# Patient Record
Sex: Male | Born: 1990
Health system: Southern US, Community
[De-identification: ages and names within clinical notes are randomized; demographics above are authoritative.]

## PROBLEM LIST (undated history)

## (undated) DIAGNOSIS — L309 Dermatitis, unspecified: Secondary | ICD-10-CM

## (undated) DIAGNOSIS — M1990 Unspecified osteoarthritis, unspecified site: Secondary | ICD-10-CM

## (undated) HISTORY — DX: Dermatitis, unspecified: L30.9

## (undated) HISTORY — DX: Unspecified osteoarthritis, unspecified site: M19.90

---
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
## 2008-02-02 ENCOUNTER — Emergency Department (HOSPITAL_COMMUNITY): Admission: EM | Admit: 2008-02-02 | Discharge: 2008-02-02 | Payer: Self-pay | Admitting: Emergency Medicine

## 2008-10-06 ENCOUNTER — Emergency Department (HOSPITAL_COMMUNITY): Admission: EM | Admit: 2008-10-06 | Discharge: 2008-10-06 | Payer: Self-pay | Admitting: Family Medicine

## 2009-02-05 ENCOUNTER — Emergency Department (HOSPITAL_COMMUNITY): Admission: EM | Admit: 2009-02-05 | Discharge: 2009-02-05 | Payer: Self-pay | Admitting: Emergency Medicine

## 2010-04-26 ENCOUNTER — Emergency Department (HOSPITAL_COMMUNITY)
Admission: EM | Admit: 2010-04-26 | Discharge: 2010-04-26 | Payer: Self-pay | Source: Home / Self Care | Admitting: Family Medicine

## 2011-03-03 LAB — CULTURE, ROUTINE-ABSCESS

## 2011-05-04 ENCOUNTER — Inpatient Hospital Stay (INDEPENDENT_AMBULATORY_CARE_PROVIDER_SITE_OTHER)
Admission: RE | Admit: 2011-05-04 | Discharge: 2011-05-04 | Disposition: A | Discharge status: Home / Self Care | Payer: Self-pay | Source: Ambulatory Visit | Attending: Family Medicine | Admitting: Family Medicine

## 2011-05-04 DIAGNOSIS — L910 Hypertrophic scar: Secondary | ICD-10-CM

## 2012-04-30 ENCOUNTER — Encounter (HOSPITAL_COMMUNITY): Payer: Self-pay | Admitting: *Deleted

## 2012-04-30 ENCOUNTER — Emergency Department (INDEPENDENT_AMBULATORY_CARE_PROVIDER_SITE_OTHER)
Admission: EM | Admit: 2012-04-30 | Discharge: 2012-04-30 | Disposition: A | Discharge status: Home / Self Care | Payer: BC Managed Care – PPO | Source: Home / Self Care

## 2012-04-30 DIAGNOSIS — H6690 Otitis media, unspecified, unspecified ear: Secondary | ICD-10-CM

## 2012-04-30 MED ORDER — TRAMADOL HCL 50 MG PO TABS: 50.0000 mg | ORAL_TABLET | Freq: Four times a day (QID) | ORAL | Status: AC | PRN

## 2012-04-30 MED ORDER — AMOXICILLIN 500 MG PO CAPS: 500.0000 mg | ORAL_CAPSULE | Freq: Three times a day (TID) | ORAL | Status: AC

## 2012-04-30 NOTE — ED Notes (Signed)
Pt reports left ear pain since friday

## 2012-04-30 NOTE — Discharge Instructions (Signed)
Otitis Media, Adult  A middle ear infection is an infection in the space behind the eardrum. The medical name for this is "otitis media." It may happen after a common cold. It is caused by a germ that starts growing in that space. You may feel swollen glands in your neck on the side of the ear infection.  HOME CARE INSTRUCTIONS   · Take your medicine as directed until it is gone, even if you feel better after the first few days.  · Only take over-the-counter or prescription medicines for pain, discomfort, or fever as directed by your caregiver.  · Occasional use of a nasal decongestant a couple times per day may help with discomfort and help the eustachian tube to drain better.  Follow up with your caregiver in 10 to 14 days or as directed, to be certain that the infection has cleared. Not keeping the appointment could result in a chronic or permanent injury, pain, hearing loss and disability. If there is any problem keeping the appointment, you must call back to this facility for assistance.  SEEK IMMEDIATE MEDICAL CARE IF:   · You are not getting better in 2 to 3 days.  · You have pain that is not controlled with medication.  · You feel worse instead of better.  · You cannot use the medication as directed.  · You develop swelling, redness or pain around the ear or stiffness in your neck.  MAKE SURE YOU:   · Understand these instructions.  · Will watch your condition.  · Will get help right away if you are not doing well or get worse.  Document Released: 08/12/2004 Document Revised: 10/27/2011 Document Reviewed: 06/13/2008  ExitCare® Patient Information ©2012 [employer].

## 2012-04-30 NOTE — ED Provider Notes (Signed)
History     CSN: [id number]  Arrival date & time 04/30/12  1446   First MD Initiated Contact with Patient 04/30/12 1531      Chief Complaint  Patient presents with  . Otalgia     HPI Patient is 21 year old male who presents with main concern of left-sided ear pain that initially started one week ago and has been getting progressively worse. Patient reports history of ear infections in the last one being approximately year ago. He describes pain as sharp, intermittent, 7/10 in severity when present, no specific alleviating or aggravating symptoms, nonradiating pain. Patient denies any fevers and chills, other systemic symptoms. No recent trauma to ear, no recent use of antibiotics.  History reviewed. No pertinent past medical history.  History reviewed. No pertinent past surgical history.  History reviewed. No pertinent family history.  History  Substance Use Topics  . Smoking status: Never Smoker   . Smokeless tobacco: Never Used  . Alcohol Use: Yes     pt reports usage as twice a month     Review of Systems  Constitutional: Denies fever, chills, diaphoresis, appetite change and fatigue.  HEENT: Denies photophobia, eye pain, redness, hearing loss, sore throat, rhinorrhea, sneezing, mouth sores, trouble swallowing, neck pain, neck stiffness and tinnitus.   Respiratory: Denies SOB, DOE, cough, chest tightness,  and wheezing.   Cardiovascular: Denies chest pain, palpitations and leg swelling.  Gastrointestinal: Denies nausea, vomiting, abdominal pain, diarrhea, constipation, blood in stool and abdominal distention.  Genitourinary: Denies dysuria, urgency, frequency, hematuria, flank pain and difficulty urinating.  Musculoskeletal: Denies myalgias, back pain, joint swelling, arthralgias and gait problem.  Skin: Denies pallor, rash and wound.  Neurological: Denies dizziness, seizures, syncope, weakness, light-headedness, numbness and headaches.  Hematological: Denies adenopathy.  Easy bruising, personal or family bleeding history    Allergies  Review of patient's allergies indicates not on file.  Home Medications   Current Outpatient Rx  Name Route Sig Dispense Refill  . AMOXICILLIN 500 MG PO CAPS Oral Take 1 capsule (500 mg total) by mouth 3 (three) times daily. 21 capsule 0  . TRAMADOL HCL 50 MG PO TABS Oral Take 1 tablet (50 mg total) by mouth every 6 (six) hours as needed for pain. 30 tablet 0    BP 146/83  Pulse 57  Temp(Src) 98.9 F (37.2 C) (Oral)  Resp 18  SpO2 98%  Physical Exam  Constitutional: Vital signs reviewed.  Patient is a well-developed and well-nourished in no acute distress and cooperative with exam. Alert and oriented x3.  Head: Normocephalic and atraumatic Ear: TM normal on the right side. Left ear tympanic membrane erythematous and swollen, patient has significant pain during the examination, no pus or blood noted Mouth: no erythema or exudates, MMM Eyes: PERRL, EOMI, conjunctivae normal, No scleral icterus.  Neck: Supple, Trachea midline normal ROM, No JVD, mass, thyromegaly, or carotid bruit present.  Cardiovascular: RRR, S1 normal, S2 normal, no MRG, pulses symmetric and intact bilaterally Pulmonary/Chest: CTAB, no wheezes, rales, or rhonchi  ED Course  Procedures (including critical care time)   1. Ear infection    - Patient symptoms and physical exam findings consistent with otitis media - I will provide course of antibiotic - Patient was advised if symptoms do not improve or get worse he needs to see primary care physician - Patient also advised not to use Q-tips during the infection  MDM  Otitis media and antibiotic to be used for 1 week  Dorothea Ogle, MD 04/30/12 [phone]

## 2012-07-24 ENCOUNTER — Ambulatory Visit: Payer: Self-pay | Admitting: Internal Medicine

## 2012-09-07 ENCOUNTER — Ambulatory Visit: Payer: BC Managed Care – PPO | Admitting: Internal Medicine

## 2012-09-07 DIAGNOSIS — Z0289 Encounter for other administrative examinations: Secondary | ICD-10-CM

## 2014-07-10 ENCOUNTER — Emergency Department (HOSPITAL_COMMUNITY): Payer: Self-pay

## 2014-07-10 ENCOUNTER — Encounter (HOSPITAL_COMMUNITY): Payer: Self-pay | Admitting: Emergency Medicine

## 2014-07-10 ENCOUNTER — Emergency Department (HOSPITAL_COMMUNITY)
Admission: EM | Admit: 2014-07-10 | Discharge: 2014-07-10 | Disposition: A | Discharge status: Home / Self Care | Payer: Self-pay | Attending: Emergency Medicine | Admitting: Emergency Medicine

## 2014-07-10 DIAGNOSIS — M545 Low back pain, unspecified: Secondary | ICD-10-CM | POA: Insufficient documentation

## 2014-07-10 DIAGNOSIS — M549 Dorsalgia, unspecified: Secondary | ICD-10-CM

## 2014-07-10 DIAGNOSIS — IMO0001 Reserved for inherently not codable concepts without codable children: Secondary | ICD-10-CM | POA: Insufficient documentation

## 2014-07-10 DIAGNOSIS — R52 Pain, unspecified: Secondary | ICD-10-CM | POA: Insufficient documentation

## 2014-07-10 MED ORDER — TRAMADOL-ACETAMINOPHEN 37.5-325 MG PO TABS: 1.0000 | ORAL_TABLET | Freq: Four times a day (QID) | ORAL | Status: DC | PRN

## 2014-07-10 MED ORDER — METHOCARBAMOL 500 MG PO TABS: 500.0000 mg | ORAL_TABLET | Freq: Two times a day (BID) | ORAL | Status: DC

## 2014-07-10 NOTE — Discharge Instructions (Signed)
Take the prescribed medication as directed. Follow-up with a primary care physician in the area for close monitoring of symptoms and to re-check blood pressure. Return to the ED for new or worsening symptoms.   Emergency Department Resource Guide 1) Find a Doctor and Pay Out of Pocket Although you won't have to find out who is covered by your insurance plan, it is a good idea to ask around and get recommendations. You will then need to call the office and see if the doctor you have chosen will accept you as a new patient and what types of options they offer for patients who are self-pay. Some doctors offer discounts or will set up payment plans for their patients who do not have insurance, but you will need to ask so you aren't surprised when you get to your appointment.  2) Contact Your Local Health Department Not all health departments have doctors that can see patients for sick visits, but many do, so it is worth a call to see if yours does. If you don't know where your local health department is, you can check in your phone book. The CDC also has a tool to help you locate your state's health department, and many state websites also have listings of all of their local health departments.  3) Find a Walk-in Clinic If your illness is not likely to be very severe or complicated, you may want to try a walk in clinic. These are popping up all over the country in pharmacies, drugstores, and shopping centers. They're usually staffed by nurse practitioners or physician assistants that have been trained to treat common illnesses and complaints. They're usually fairly quick and inexpensive. However, if you have serious medical issues or chronic medical problems, these are probably not your best option.  No Primary Care Doctor: - Call Health Connect at  [phone] - they can help you locate a primary care doctor that  accepts your insurance, provides certain services, etc. - Physician Referral Service-  [phone]  Chronic Pain Problems: Organization         Address  Phone   Notes  [facility]  [phone] Patients need to be referred by their primary care doctor.   Medication Assistance: Organization         Address  Phone   Notes  [facility] [address] [place], [place] [address] [phone]) [phone] --Must be a resident of [place] -- Must have NO insurance coverage whatsoever (no Medicaid/ Medicare, etc.) -- The pt. MUST have a primary care doctor that directs their care regularly and follows them in the community   MedAssist  [phone]   [employer]  [phone]) [phone]    Agencies that provide inexpensive medical care: Organization         Address  Phone   Notes  [facility]  [phone]) [phone]   [facility]    [phone]   [facility] [address] [place], [place] [address] [phone]) [phone]   [facility] [address], [place] [phone]) [phone]   [facility]anned Parenthood    [phone]   [facility]    [phone]   [facility]  [address], [place] Phone:  [phone], Fax:  [phone]) [phone] Hours of Operation:  9 am - 6 pm, M-F.  Also accepts Medicaid/Medicare and self-pay.  [facility]  [address], [place] Phone: [phone], Fax: [phone]. Hours of Operation:  8:30 am - 5:30 pm, M-F.  Also accepts Medicaid and self-pay.  [facility] [place] [address] [place] Phone: [phone]   [place], [place], [place] [phone], Ext. [phone] Mondays & Thursdays: 7-9 AM.  First 15 patients are seen on a first come, first serve basis.    [place] Providers:  Organization         Address  Phone   Notes  [facility] [address],  [place] [phone] Also accepts self-pay patients.  [facility] [id number] [address], [place]  [phone]   [place], [address], [place] [phone]   [facility] [address] [place] [phone]   Lucianne Lei [address], [place]   [phone] Only accepts [place] Access [place] patients after they have their name applied to their card.   Self-Pay (no insurance) in [facility]:  Organization         Address  Phone   Notes  Sickle Cell Patients, [facility] [place] [phone]   [facility] Urgent Care [place] [phone]   [facility] [place]  [place], [place], [place] [phone]   [facility]/Dr. Osei-Bonsu  [address] [place] or [address], [place] [phone] Phone number for both [place] and [place] locations is the same.  [facility]gent [facility] and [facility] [address] [place] [phone]   [facility] [address] [place] or [address] [place] [address] [phone]   [facility] [address] [place] [phone], phone; [phone], fax Sees patients 1st and 3rd Saturday of every month.  Must not qualify for public or private insurance (i.e. Medicaid, Medicare, [place] Health Choice, Veterans' Benefits)  Household income should be no more than 200% of the poverty level The clinic cannot treat you if you are pregnant or think you are pregnant  Sexually transmitted diseases are not treated at the clinic.    Dental Care: Organization         Address  Phone  Notes  [facility] of [facility] [phone] Accepts children up to age 64 who are enrolled in  [place] or [place]; pregnant women with a Medicaid card; and children who have applied for Medicaid or [place] Health Choice, but were declined, whose parents can pay a reduced fee at time of service.  [facility] Department of [facility]  [address], [place] [phone] Accepts children up to age 59 who are enrolled in [place] or [place]; pregnant women with a Medicaid card; and children who have applied for Medicaid or [place] Health Choice, but were declined, whose parents can pay a reduced fee at time of service.  [facility] Adult [facility] Access PROGRAM  [place] [phone] Patients are seen by appointment only. Walk-ins are not accepted. [place] will see patients 70 years of age and older. Monday - Tuesday (8am-5pm) Most Wednesdays (8:30-5pm) $30 per visit, cash only  [place] [facility]  [address], [facility] [phone] Patients are seen by appointment only. Walk-ins are not accepted. [place] will see patients 55 years of age and older. One Wednesday Evening (Monthly: Volunteer Based).  $30 per visit, cash only  [place]  [phone] for adults; Children under age 16, call Graduate Pediatric Dentistry at [phone]. Children aged 15-14, please call [phone] to request a pediatric application.  Dental services are provided in all areas of dental care including fillings, crowns and bridges, complete and partial dentures, implants, gum treatment, root canals, and extractions. Preventive care is also provided. Treatment is provided to both adults and children. Patients are selected via a lottery and there is often a waiting list.   [facility] [address] [place]  [phone] [email or web address]   [facility] [address] [place], [place] [phone], Ext. [phone] Second and Fourth Thursday of each month, opens at 6:30  AM; Clinic ends at 9 AM.  Patients are seen on a first-come first-served basis, and a limited number are seen during each clinic.   [facility]  [address] Hillard Danker [place], [place] [phone]   Eligibility Requirements You must have lived in [place], [place], or [place] counties for at least the last three months.   You cannot be eligible for state or federal sponsored [employer], including [employer], [place], or [employer].   You generally cannot be eligible for healthcare insurance through your employer.    How to apply: Eligibility screenings are held every Tuesday and Wednesday afternoon from 1:00 pm until 4:00 pm. You do not need an appointment for the interview!  [facility] [address] [place], [place]   [employer]  [place] Department  [place]  [phone]    Behavioral Health Resources in the Community: Intensive Outpatient Programs Organization         Address  Phone  Notes  [place]. [address], [place], [place] [phone]   [facility]patient [address], [place], [employer]   ADS: Alcohol & Drug Svcs [address], [place], [place]   [place] [address]  [place], [place] or [phone]   Substance Abuse Resources Organization         Address  Phone  Notes  Alcohol and Drug Services  [phone]   [place]  [phone]   The [place]   Chinita Pester  [phone]   Residential & Outpatient Substance Abuse Program  [phone]   Psychological Services Organization         Address  Phone  Notes  [facility] [place]  [place]  [phone]   [place] [address] [place] [phone] or  [phone]    Mobile Crisis Teams Organization         Address  Phone  Notes  [facility], Mobile Crisis Care Unit  [phone]   [facility] Psych[facility]  [address] [place], [place]   [place] [address] [place] [phone]    Self-Help/Support Groups Organization         Address  Phone             Notes  [facility]ental Health Assoc[facility] of [place] - variety of support groups  [place] Call for more information  Narcotics Anonymous (NA), Caring Services [address], [employer]   2 meetings at this location   Special educational needs teacher         Address  Phone  Notes  [facility] [place],    [place]  [phone]   [facility]  [address] [place] [phone], [place], [place]   [place], [place] [phone] Admissions: 8am-3pm M-F  Incentives Substance [place] [address]    [place], [place] [phone]   [facility] [address] [place], [place], [place]   [facility] [address]  [place], [place]   [facility] - Intensive Outpatient [address], [place], [place]   [facility] ([place].,  [place], [place] [phone] or [phone]   [facility] Services (RTS) [address], [place], [place] Accepts Medicaid  [facility] [address],  [place] [phone] Substance Abuse/Addiction Treatment   [facility] Organization         Address  Phone  Notes  [facility]  [phone]   Domenic Schwab, PhD [address] Arlis Porta [place], [place]   [phone] or [phone]   [place], [place] [phone]   [facility] [address]  [place], [place] [phone] Insurance/Medicaid/sponsorship through [facility] and Families [address], [address] [place]                                    [place], [place] [phone] [place] [address], [place] [phone]    Dr. Adele Schilder  [phone]   [facility] Dept. 1) [address], [place] 2) [place] 3)  [place] [address], [place] [phone]  [phone]   [place] [phone] or [phone] (After Hours)

## 2014-07-10 NOTE — ED Provider Notes (Signed)
CSN: [id number]     Arrival date & time 07/10/14  1111 History  This chart was scribed for Ethelda ChickMartha K Linker, MD by Elon SpannerGarrett Cook, ED Scribe. This patient was seen in room WTR6/WTR6 and the patient's care was started at 12:04 PM.    Chief Complaint  Patient presents with  . Back Pain    The history is provided by the patient. No language interpreter was used.    HPI Comments: James Quinn is a 23 y.o. male who presents to the Emergency Department complaining of an acute exacerbation of intermittent, central lower back pain without radiation onset 1 year ago with worsening 1 month ago.  Patient states he was a frequent weightlifter, alternating between lighter and heavier weights before the pain in the area caused him to stop several months ago.  He also states he has a disabled friend who he would pick up at times.  Patient decribes the pain as "tight" and "stiff" in the areas surrounding his central lower back with the most pain in his central lower pack.  He states he has difficulty standing for long periods of time without an exacerbation of the pain.  He states that rest relieves the pain.  Patient denies injury to area, surgery to area, imaging of area.  Patient denies numbness/tingling of legs, headaches, vision changes, history of HTN.  No numbness, paresthesias or weakness of extremities.  No loss of bowel or bladder control.   History reviewed. No pertinent past medical history. History reviewed. No pertinent past surgical history. History reviewed. No pertinent family history. History  Substance Use Topics  . Smoking status: Never Smoker   . Smokeless tobacco: Never Used  . Alcohol Use: Yes     Comment: occ    Review of Systems  Eyes: Negative for visual disturbance.  Musculoskeletal: Positive for myalgias.  All other systems reviewed and are negative.     Allergies  Review of patient's allergies indicates no known allergies.  Home Medications   Prior to Admission  medications   Medication Sig Start Date End Date Taking? Authorizing Provider  Aspirin-Salicylamide-Caffeine (BC FAST PAIN RELIEF) [phone] MG PACK Take 1 packet by mouth every 6 (six) hours as needed (for pain).   Yes Historical Provider, MD   BP 159/107  Pulse 96  Temp(Src) 99.2 F (37.3 C) (Oral)  Resp 16  SpO2 97%  Physical Exam  Nursing note and vitals reviewed. Constitutional: He is oriented to person, place, and time. He appears well-developed and well-nourished.  HENT:  Head: Normocephalic and atraumatic.  Mouth/Throat: Oropharynx is clear and moist.  Eyes: Conjunctivae and EOM are normal. Pupils are equal, round, and reactive to light.  Neck: Normal range of motion.  Cardiovascular: Normal rate, regular rhythm and normal heart sounds.   Pulmonary/Chest: Effort normal and breath sounds normal. No respiratory distress. He has no wheezes.  Abdominal: Soft.  Musculoskeletal: Normal range of motion.  Exam limited due to body habitus, endorses pain of midline lumbar spine however no tenderness to palpation; normal strength and sensation of BLE; negative SLR bilaterally; ambulating unassisted without difficulty  Neurological: He is alert and oriented to person, place, and time.  Skin: Skin is warm and dry.  Psychiatric: He has a normal mood and affect.    ED Course  Procedures (including critical care time)  DIAGNOSTIC STUDIES: Oxygen Saturation is 97% on RA, normal by my interpretation.    COORDINATION OF CARE:  12:11 PM Discussed plan to order imaging of pt's  back.  Patient acknowledges and agrees with plan.    Labs Review Labs Reviewed - No data to display  Imaging Review Dg Lumbar Spine Complete  07/10/2014   CLINICAL DATA:  Mid and low back pain without trauma.  EXAM: LUMBAR SPINE - COMPLETE 4+ VIEW  COMPARISON:  None.  FINDINGS: Five lumbar type vertebral bodies. Sacroiliac joints are symmetric. Maintenance of vertebral body height and alignment. Possible  degenerative disc disease at the lumbosacral junction. Suboptimally evaluated.  IMPRESSION: No acute osseous abnormality.   Electronically Signed   By: Jeronimo Greaves M.D.   On: 07/10/2014 12:27     EKG Interpretation None      MDM   Final diagnoses:  Back pain, unspecified location   Imaging negative for acute abnormalities. No red flag symptoms on exam to suggest cauda equina, spinal cord injury, or other serious pathology. Patient will be discharged home with pain medication. His blood pressure is elevated however he has no signs of end organ damage on exam today. I recommended that he follow up with her primary care physician for recheck of his back as well as monitoring of his blood pressure, resource guide given.  Discussed plan with patient, he/she acknowledged understanding and agreed with plan of care.  Return precautions given for new or worsening symptoms.  I personally performed the services described in this documentation, which was scribed in my presence. The recorded information has been reviewed and is accurate.  Garlon Hatchet, PA-C 07/10/14 1311

## 2014-07-10 NOTE — ED Provider Notes (Signed)
Medical screening examination/treatment/procedure(s) were performed by non-physician practitioner and as supervising physician I was immediately available for consultation/collaboration.   EKG Interpretation None       Ethelda ChickMartha K Linker, MD 07/10/14 1313

## 2014-07-10 NOTE — ED Notes (Signed)
Pt c/o increasing lower back pain x 1 year.  Pain score 6/10.  Denies injury.  Pt sts "I thought that it was from lifting weights.  Sometimes, it gets so bad I can't sleep or if I'm walking, I have to stop and bend over to stretch it out."  Denies GU complaints and urinary complaints.  Denies numbness and tingling.

## 2015-02-11 ENCOUNTER — Ambulatory Visit: Payer: Self-pay | Admitting: Family

## 2015-04-10 ENCOUNTER — Other Ambulatory Visit (INDEPENDENT_AMBULATORY_CARE_PROVIDER_SITE_OTHER): Payer: PRIVATE HEALTH INSURANCE

## 2015-04-10 ENCOUNTER — Ambulatory Visit (INDEPENDENT_AMBULATORY_CARE_PROVIDER_SITE_OTHER): Payer: PRIVATE HEALTH INSURANCE | Admitting: Family

## 2015-04-10 ENCOUNTER — Encounter: Payer: Self-pay | Admitting: Family

## 2015-04-10 DIAGNOSIS — Z23 Encounter for immunization: Secondary | ICD-10-CM | POA: Diagnosis not present

## 2015-04-10 LAB — TSH: TSH: 4.81 u[IU]/mL — AB (ref 0.35–4.50)

## 2015-04-10 NOTE — Patient Instructions (Signed)
Thank you for choosing [facility]. Y Summary/Instructions:   Please start using MyFitnessPal and tracking your intake and exercise. Begin to think about all foods that you are eating and how they are helping you reach your goal.    Please stop in the lab before leaving located in the basement.   Exercise to Lose Weight Exercise and a healthy diet may help you lose weight. Your doctor may suggest specific exercises. EXERCISE IDEAS AND TIPS  Choose low-cost things you enjoy doing, such as walking, bicycling, or exercising to workout videos.  Take stairs instead of the elevator.  Walk during your lunch break.  Park your car further away from work or school.  Go to a gym or an exercise class.  Start with 5 to 10 minutes of exercise each day. Build up to 30 minutes of exercise 4 to 6 days a week.  Wear shoes with good support and comfortable clothes.  Stretch before and after working out.  Work out until you breathe harder and your heart beats faster.  Drink extra water when you exercise.  Do not do so much that you hurt yourself, feel dizzy, or get very short of breath. Exercises that burn about 150 calories:  Running 1  miles in 15 minutes.  Playing volleyball for 45 to 60 minutes.  Washing and waxing a car for 45 to 60 minutes.  Playing touch football for 45 minutes.  Walking 1  miles in 35 minutes.  Pushing a stroller 1  miles in 30 minutes.  Playing basketball for 30 minutes.  Raking leaves for 30 minutes.  Bicycling 5 miles in 30 minutes.  Walking 2 miles in 30 minutes.  Dancing for 30 minutes.  Shoveling snow for 15 minutes.  Swimming laps for 20 minutes.  Walking up stairs for 15 minutes.  Bicycling 4 miles in 15 minutes.  Gardening for 30 to 45 minutes.  Jumping rope for 15 minutes.  Washing windows or floors for 45 to 60 minutes. Document Released: 12/10/2010 Document Revised: 01/30/2012 Document Reviewed: 12/10/2010 [facility]  Patient Information 2015 [place], [place]. This information is not intended to replace advice given to you by your health care provider. Make sure you discuss any questions you have with your health care provider.

## 2015-04-10 NOTE — Progress Notes (Signed)
Pre visit review using our clinic review tool, if applicable. No additional management support is needed unless otherwise documented below in the visit note. 

## 2015-04-10 NOTE — Progress Notes (Signed)
Subjective:    Patient ID: James Quinn, male    DOB: 17-Feb-1991, 24 y.o.   MRN: [id number]  Chief Complaint  Patient presents with  . Establish Care    wants to lose weight, says wants to know how to lose weight the correct way, tries to go to the gym 3 times a week, having trouble eating good    HPI:  James Quinn is a 24 y.o. male with a PMH of morbid obesity who presents today for an office visit to establish care.  1) Weight - Has had the associated symptom of overwieght/obese since high school when he played football. Maintained in the 300-400 range for about 2 years after high school and has continued to gain weight since then. Modifying factors include various diets which helped a little, but when the holidays started the diet fell apart. Severity of the weight has been effecting hjs joints on occasion. Averages about 2 meals per day described as large and occasionally will binge eat at night. Consisting of primarily of meats and starches and vegetables. Exercise habits are 2-3 times per week and does resistance training with minimal cardio for about an hour and a half.    Wt Readings from Last 3 Encounters:  04/10/15 499 lb (226.345 kg)    No Known Allergies   Outpatient Prescriptions Prior to Visit  Medication Sig Dispense Refill  . Aspirin-Salicylamide-Caffeine (BC FAST PAIN RELIEF) 650-195-33.3 MG PACK Take 1 packet by mouth every 6 (six) hours as needed (for pain).    . methocarbamol (ROBAXIN) 500 MG tablet Take 1 tablet (500 mg total) by mouth 2 (two) times daily. 20 tablet 0  . traMADol-acetaminophen (ULTRACET) 37.5-325 MG per tablet Take 1 tablet by mouth every 6 (six) hours as needed. 20 tablet 0   No facility-administered medications prior to visit.     Past Medical History  Diagnosis Date  . Arthritis     lower back     History reviewed. No pertinent past surgical history.   Family History  Problem Relation Age of Onset  . Arthritis  Father   . Diabetes Sister   . Arthritis Maternal Grandmother   . Prostate cancer Maternal Grandfather   . Arthritis Paternal Grandmother      History   Social History  . Marital Status: Single    Spouse Name: N/A  . Number of Children: 0  . Years of Education: 14   Occupational History  . Engineer, materialsecurity Officer    Social History Main Topics  . Smoking status: Never Smoker   . Smokeless tobacco: Never Used  . Alcohol Use: Yes     Comment: occ  . Drug Use: No  . Sexual Activity: Yes   Other Topics Concern  . Not on file   Social History Narrative   Fun: Workout, movies, go out bowling.   Denies religious beliefs effecting healthcare.     Review of Systems  Constitutional: Negative for fever and chills.  Respiratory: Negative for chest tightness and shortness of breath.   Cardiovascular: Negative for chest pain, palpitations and leg swelling.  Endocrine: Negative for polydipsia, polyphagia and polyuria.      Objective:    BP 160/88 mmHg  Pulse 66  Temp(Src) 99.2 F (37.3 C) (Oral)  Resp 18  Ht 6' (1.829 m)  Wt 499 lb (226.345 kg)  BMI 67.66 kg/m2  SpO2 98% Nursing note and vital signs reviewed.  Physical Exam  Constitutional: He is oriented to person,  place, and time. He appears well-developed and well-nourished. No distress.  Morbidly obese gentleman seated in the chair, appears older than his stated age and is dressed appropriately for the situation.  Cardiovascular: Normal rate, regular rhythm, normal heart sounds and intact distal pulses.   Pulmonary/Chest: Effort normal and breath sounds normal.  Neurological: He is alert and oriented to person, place, and time.  Skin: Skin is warm and dry.  Psychiatric: He has a normal mood and affect. His behavior is normal. Judgment and thought content normal.       Assessment & Plan:

## 2015-04-10 NOTE — Assessment & Plan Note (Signed)
BMI of 67.6 indicates severe morbid obesity. Interested in losing weight. Obtain TSH to rule out thyroid disease. Discussed with patient importance of increasing physical activity to 30 minutes of moderate level intensity every day of the week. Also improving nutrient density of his diet. Increase fruit and vegetable intake and decreasing saturated fat intake. Discussed as little as 200 cal per day less than when he's already consuming may result in weight loss as opposed to weight gain. Recommend calorie counting with MyFitnessPal to track his progress. Follow-up in one month to review log and weigh-in.

## 2015-04-12 ENCOUNTER — Telehealth: Payer: Self-pay | Admitting: Family

## 2015-04-12 DIAGNOSIS — R7989 Other specified abnormal findings of blood chemistry: Secondary | ICD-10-CM

## 2015-04-12 NOTE — Telephone Encounter (Signed)
Please inform patient that his thyroid function is slightly elevated, indicating a potential for hypothyroidism. To confirm this I would like him to complete additional blood work at his convenience. No fasting is needed and this can be completed anytime of the day. If the reading is confirmed with the new tests, we will consider replacing thyroid hormone.

## 2015-04-13 ENCOUNTER — Other Ambulatory Visit (INDEPENDENT_AMBULATORY_CARE_PROVIDER_SITE_OTHER): Payer: PRIVATE HEALTH INSURANCE

## 2015-04-13 ENCOUNTER — Other Ambulatory Visit: Payer: Self-pay | Admitting: Family

## 2015-04-13 DIAGNOSIS — R7989 Other specified abnormal findings of blood chemistry: Secondary | ICD-10-CM

## 2015-04-13 DIAGNOSIS — R946 Abnormal results of thyroid function studies: Secondary | ICD-10-CM | POA: Diagnosis not present

## 2015-04-13 LAB — T4, FREE: Free T4: 0.64 ng/dL (ref 0.60–1.60)

## 2015-04-13 LAB — TSH: TSH: 3.25 u[IU]/mL (ref 0.35–4.50)

## 2015-04-13 NOTE — Telephone Encounter (Signed)
Pt aware of results 

## 2015-04-13 NOTE — Telephone Encounter (Signed)
Patient returned your call.

## 2015-04-13 NOTE — Telephone Encounter (Signed)
LVM for pt to call back.

## 2015-04-14 ENCOUNTER — Telehealth: Payer: Self-pay | Admitting: Family

## 2015-04-14 LAB — THYROID ANTIBODIES: Thyroperoxidase Ab SerPl-aCnc: 1 IU/mL (ref <9)

## 2015-04-14 NOTE — Telephone Encounter (Signed)
Pt aware.

## 2015-04-14 NOTE — Telephone Encounter (Signed)
Your repeat labs show that your thyroid function is within the normal ranges and right now there are no indications for medication. We will continue to monitor your thyroid annually or more if needed.

## 2015-04-24 ENCOUNTER — Encounter (HOSPITAL_COMMUNITY): Payer: Self-pay | Admitting: Emergency Medicine

## 2015-04-24 ENCOUNTER — Emergency Department (HOSPITAL_COMMUNITY)
Admission: EM | Admit: 2015-04-24 | Discharge: 2015-04-24 | Disposition: A | Discharge status: Home / Self Care | Payer: PRIVATE HEALTH INSURANCE | Attending: Emergency Medicine | Admitting: Emergency Medicine

## 2015-04-24 DIAGNOSIS — Z8739 Personal history of other diseases of the musculoskeletal system and connective tissue: Secondary | ICD-10-CM | POA: Insufficient documentation

## 2015-04-24 DIAGNOSIS — H6692 Otitis media, unspecified, left ear: Secondary | ICD-10-CM | POA: Diagnosis not present

## 2015-04-24 DIAGNOSIS — H9202 Otalgia, left ear: Secondary | ICD-10-CM | POA: Diagnosis present

## 2015-04-24 MED ORDER — AMOXICILLIN-POT CLAVULANATE 875-125 MG PO TABS: 1.0000 | ORAL_TABLET | Freq: Two times a day (BID) | ORAL | Status: DC

## 2015-04-24 MED ORDER — AMOXICILLIN-POT CLAVULANATE 875-125 MG PO TABS
1.0000 | ORAL_TABLET | Freq: Once | ORAL | Status: AC
  Administered 2015-04-24: 1 via ORAL
  Filled 2015-04-24: qty 1

## 2015-04-24 MED ORDER — ACETAMINOPHEN 500 MG PO TABS
1000.0000 mg | ORAL_TABLET | Freq: Once | ORAL | Status: AC
  Administered 2015-04-24: 1000 mg via ORAL
  Filled 2015-04-24: qty 2

## 2015-04-24 NOTE — ED Provider Notes (Signed)
CSN: [id number]     Arrival date & time 04/24/15  1942 History  This chart was scribed for a non-physician practitioner, Emilia BeckKaitlyn Travious Vanover, PA-C working with Mirian MoMatthew Gentry, MD by [place] Peace, ED Scribe. The patient was seen in WTR8/WTR8. The patient's care was started at 9:41 PM.    Chief Complaint  Patient presents with  . Otalgia      Patient is a 24 y.o. male presenting with ear pain. The history is provided by the patient. No language interpreter was used.  Otalgia Associated symptoms: no ear discharge, no fever and no sore throat     HPI Comments: James Quinn is a 24 y.o. male who presents to the Emergency Department complaining of left ear pain and fullness x 2 days that has gotten progressively worse. Pt notes he has tried using OTC ear drops and "Sweet Oil" to address symptoms without any relief. He denies any ear drainage, fever, chills, or sore throat.    Past Medical History  Diagnosis Date  . Arthritis     lower back   History reviewed. No pertinent past surgical history. Family History  Problem Relation Age of Onset  . Arthritis Father   . Diabetes Sister   . Arthritis Maternal Grandmother   . Prostate cancer Maternal Grandfather   . Arthritis Paternal Grandmother    History  Substance Use Topics  . Smoking status: Never Smoker   . Smokeless tobacco: Never Used  . Alcohol Use: Yes     Comment: occ    Review of Systems  Constitutional: Negative for fever and chills.  HENT: Positive for ear pain (left). Negative for ear discharge and sore throat.   All other systems reviewed and are negative.     Allergies  Review of patient's allergies indicates no known allergies.  Home Medications   Prior to Admission medications   Not on File   BP 160/86 mmHg  Pulse 87  Temp(Src) 99.1 F (37.3 C) (Oral)  Resp 18  SpO2 99% Physical Exam  Constitutional: He is oriented to person, place, and time. He appears well-developed and well-nourished. No  distress.  HENT:  Head: Normocephalic and atraumatic.  Mouth/Throat: Oropharynx is clear and moist. No oropharyngeal exudate.  Pain with manipulation of the left auricle. External left ear canal edematous. Tenderness to palpation of preauricular and post auricular area. Right ear unremarkable.   Eyes: Conjunctivae and EOM are normal.  Neck: Normal range of motion. Neck supple. No tracheal deviation present.  Cardiovascular: Normal rate.   Pulmonary/Chest: Effort normal. No respiratory distress.  Abdominal: Soft. He exhibits no distension. There is no tenderness. There is no rebound.  Musculoskeletal: Normal range of motion.  Neurological: He is alert and oriented to person, place, and time.  Skin: Skin is warm and dry.  Psychiatric: He has a normal mood and affect. His behavior is normal.  Nursing note and vitals reviewed.   ED Course  Procedures (including critical care time) Labs Review Labs Reviewed - No data to display  Imaging Review No results found.   EKG Interpretation None     Medications - No data to display  9:43 PM- Treatment plan was discussed with patient who verbalizes understanding and agrees.   MDM   Final diagnoses:  Acute left otitis media, recurrence not specified, unspecified otitis media type    Patient has otitis media on the left. Patient will be treated with augmentin. Vitals stable and patient afebrile.   I personally performed the services described  in this documentation, which was scribed in my presence. The recorded information has been reviewed and is accurate.    Emilia Beck, PA-C 04/24/15 2149  Mirian Mo, MD 04/25/15 1723

## 2015-04-24 NOTE — ED Notes (Signed)
Pt c/o L ear pain and fullness x 2 days. Pt Denies drainage, sore throat, fever. A&Ox4 and ambulatory. Pt has been using OTC ear drops without relief.

## 2015-04-24 NOTE — Discharge Instructions (Signed)
Take augmentin as directed until gone. Refer to attached documents for more information.  °

## 2015-04-27 ENCOUNTER — Encounter (HOSPITAL_COMMUNITY): Payer: Self-pay | Admitting: Family Medicine

## 2015-04-27 ENCOUNTER — Emergency Department (HOSPITAL_COMMUNITY)
Admission: EM | Admit: 2015-04-27 | Discharge: 2015-04-27 | Disposition: A | Discharge status: Home / Self Care | Payer: PRIVATE HEALTH INSURANCE | Attending: Emergency Medicine | Admitting: Emergency Medicine

## 2015-04-27 DIAGNOSIS — H6092 Unspecified otitis externa, left ear: Secondary | ICD-10-CM | POA: Insufficient documentation

## 2015-04-27 DIAGNOSIS — Z8739 Personal history of other diseases of the musculoskeletal system and connective tissue: Secondary | ICD-10-CM | POA: Insufficient documentation

## 2015-04-27 DIAGNOSIS — H9202 Otalgia, left ear: Secondary | ICD-10-CM | POA: Diagnosis present

## 2015-04-27 MED ORDER — CIPROFLOXACIN-HYDROCORTISONE 0.2-1 % OT SUSP: 3.0000 [drp] | Freq: Two times a day (BID) | OTIC | Status: DC

## 2015-04-27 NOTE — ED Provider Notes (Signed)
CSN: [id number]     Arrival date & time 04/27/15  0829 History  This chart was scribed for non-physician practitioner, Roxy Horseman, PA-C working with Tilden Fossa, MD found by Placido Sou, ED scribe. This patient was seen in room TR05C/TR05C and the patient's care was started at 9:19 AM    Chief Complaint  Patient presents with  . Otalgia    The history is provided by the patient. No language interpreter was used.    HPI Comments: James Quinn is a 24 y.o. male who presents to the Emergency Department complaining of worsening, mild, ear pain with onset 4 days ago. Pt notes that his ear feels swollen with pain radiating to his jaw line. Pt notes visiting his PCP 3 days prior and was prescribed amoxicillin. Pt additionally notes taking tylenol and ibuprofen for pain with little relief of symptoms. Pt also took OTC ear drops with no change in symptoms. Pt denies any history of DM.    Past Medical History  Diagnosis Date  . Arthritis     lower back   History reviewed. No pertinent past surgical history. Family History  Problem Relation Age of Onset  . Arthritis Father   . Diabetes Sister   . Arthritis Maternal Grandmother   . Prostate cancer Maternal Grandfather   . Arthritis Paternal Grandmother    History  Substance Use Topics  . Smoking status: Never Smoker   . Smokeless tobacco: Never Used  . Alcohol Use: Yes     Comment: occ    Review of Systems  Constitutional: Negative for fever and chills.  HENT: Positive for ear pain.   Respiratory: Negative for shortness of breath.   Cardiovascular: Negative for chest pain.  Gastrointestinal: Negative for nausea, vomiting, diarrhea and constipation.  Genitourinary: Negative for dysuria.      Allergies  Review of patient's allergies indicates no known allergies.  Home Medications   Prior to Admission medications   Medication Sig Start Date End Date Taking? Authorizing Provider  amoxicillin-clavulanate  (AUGMENTIN) 875-125 MG per tablet Take 1 tablet by mouth every 12 (twelve) hours. 04/24/15   Kaitlyn Szekalski, PA-C   BP 155/92 mmHg  Pulse 87  Temp(Src) 98.5 F (36.9 C) (Oral)  Resp 22 Physical Exam  Constitutional: He is oriented to person, place, and time. He appears well-developed and well-nourished. No distress.  HENT:  Head: Normocephalic and atraumatic.  Mouth/Throat: Oropharynx is clear and moist.  Left otitis externa with edematous canal, still patent for drops  Eyes: Conjunctivae and EOM are normal. Pupils are equal, round, and reactive to light.  Neck: Normal range of motion. Neck supple. No tracheal deviation present.  Cardiovascular: Normal rate and normal heart sounds.   Pulmonary/Chest: Breath sounds normal. No respiratory distress.  Abdominal: Soft. He exhibits no distension.  Musculoskeletal: Normal range of motion.  Neurological: He is alert and oriented to person, place, and time.  Skin: Skin is warm and dry.  Psychiatric: He has a normal mood and affect. His behavior is normal.  Nursing note and vitals reviewed.   ED Course  Procedures   COORDINATION OF CARE: 9:22 AM Discussed treatment plan with pt at bedside including ear drops in addition to his current amoxicillin prescription and pt agreed to plan.  Labs Review Labs Reviewed - No data to display  Imaging Review No results found.   EKG Interpretation None      MDM   Final diagnoses:  Otitis externa, left    Patient with otitis  externa.  Likely concurrent with OM.  Being treated with Augmentin.  Instructed to continue this.  Will add Cipro HC drops.  No evidence of mastitis.  I personally performed the services described in this documentation, which was scribed in my presence. The recorded information has been reviewed and is accurate.     Roxy Horsemanobert Takeyah Wieman, PA-C 04/27/15 0930  Tilden FossaElizabeth Rees, MD 04/27/15 [phone]

## 2015-04-27 NOTE — ED Notes (Signed)
C/o left ear pain. [facility] and placed on Amoxicillin, which pt reports he has been taking as directed. States ear pain is worsening.

## 2015-04-27 NOTE — ED Notes (Signed)
Pt her for continued left ear pain and worsening symptoms since Friday after abx

## 2015-04-27 NOTE — Discharge Instructions (Signed)

## 2015-04-27 NOTE — ED Notes (Signed)
Waiting for discharge instructions. Printer not working. 

## 2015-04-29 ENCOUNTER — Encounter: Payer: Self-pay | Admitting: Family

## 2015-04-29 ENCOUNTER — Ambulatory Visit (INDEPENDENT_AMBULATORY_CARE_PROVIDER_SITE_OTHER): Payer: PRIVATE HEALTH INSURANCE | Admitting: Family

## 2015-04-29 VITALS — BP 118/82 | HR 88 | Temp 97.7°F | Wt >= 6400 oz

## 2015-04-29 DIAGNOSIS — H60392 Other infective otitis externa, left ear: Secondary | ICD-10-CM | POA: Diagnosis not present

## 2015-04-29 MED ORDER — NEOMYCIN-POLYMYXIN-HC 1 % OT SOLN: 4.0000 [drp] | Freq: Three times a day (TID) | OTIC | Status: DC

## 2015-04-29 NOTE — Patient Instructions (Signed)
Thank you for choosing [facility].  Summary/Instructions:  Your prescription(s) have been submitted to your pharmacy or been printed and provided for you. Please take as directed and contact our office if you believe you are having problem(s) with the medication(s) or have any questions.  If your symptoms worsen or fail to improve, please contact our office for further instruction, or in case of emergency go directly to the emergency room at the closest medical facility.   Otitis Externa Otitis externa is a bacterial or fungal infection of the outer ear canal. This is the area from the eardrum to the outside of the ear. Otitis externa is sometimes called "swimmer's ear." CAUSES  Possible causes of infection include:  Swimming in dirty water.  Moisture remaining in the ear after swimming or bathing.  Mild injury (trauma) to the ear.  Objects stuck in the ear (foreign body).  Cuts or scrapes (abrasions) on the outside of the ear. SIGNS AND SYMPTOMS  The first symptom of infection is often itching in the ear canal. Later signs and symptoms may include swelling and redness of the ear canal, ear pain, and yellowish-white fluid (pus) coming from the ear. The ear pain may be worse when pulling on the earlobe. DIAGNOSIS  Your health care provider will perform a physical exam. A sample of fluid may be taken from the ear and examined for bacteria or fungi. TREATMENT  Antibiotic ear drops are often given for 10 to 14 days. Treatment may also include pain medicine or corticosteroids to reduce itching and swelling. HOME CARE INSTRUCTIONS   Apply antibiotic ear drops to the ear canal as prescribed by your health care provider.  Take medicines only as directed by your health care provider.  If you have diabetes, follow any additional treatment instructions from your health care provider.  Keep all follow-up visits as directed by your health care provider. PREVENTION   Keep your ear  dry. Use the corner of a towel to absorb water out of the ear canal after swimming or bathing.  Avoid scratching or putting objects inside your ear. This can damage the ear canal or remove the protective wax that lines the canal. This makes it easier for bacteria and fungi to grow.  Avoid swimming in lakes, polluted water, or poorly chlorinated pools.  You may use ear drops made of rubbing alcohol and vinegar after swimming. Combine equal parts of white vinegar and alcohol in a bottle. Put 3 or 4 drops into each ear after swimming. SEEK MEDICAL CARE IF:   You have a fever.  Your ear is still red, swollen, painful, or draining pus after 3 days.  Your redness, swelling, or pain gets worse.  You have a severe headache.  You have redness, swelling, pain, or tenderness in the area behind your ear. MAKE SURE YOU:   Understand these instructions.  Will watch your condition.  Will get help right away if you are not doing well or get worse. Document Released: 11/07/2005 Document Revised: 03/24/2014 Document Reviewed: 11/24/2011 ExitCare Patient Information 2015 [employer]. This information is not intended to replace advice given to you by your health care provider. Make sure you discuss any questions you have with your health care provider.   

## 2015-04-29 NOTE — Progress Notes (Signed)
   Subjective:    Patient ID: James Quinn, male    DOB: Mar 13, 1991, 24 y.o.   MRN: [id number]  Chief Complaint  Patient presents with  . Ear Pain    HPI:  James HurdleChristopher A Knape is a 24 y.o. male with a PMH of morbid obesity who presents today for an acute office visit.   Recently seen in the ED for left ear pain for about 2 days at the time. Diagnosed with otitis media and started on Augmentin and Tylenol. Approximately 3 days later was seen again for left ear pain and was diagnosed with otitis externa and treated with Cirpo-HC. Indicates that he was not able to start the ear drops secondary to the cost being $300.   Currently experience associated symptom of pain in his left ear described as achy and severity of 4/10. Indicates that he cannot hear anything out of his left ear. Denies any discharge.   No Known Allergies   Current Outpatient Prescriptions on File Prior to Visit  Medication Sig Dispense Refill  . amoxicillin-clavulanate (AUGMENTIN) 875-125 MG per tablet Take 1 tablet by mouth every 12 (twelve) hours. 14 tablet 0  . ciprofloxacin-hydrocortisone (CIPRO HC) otic suspension Place 3 drops into the left ear 2 (two) times daily. (Patient not taking: Reported on 04/29/2015) 10 mL 0   No current facility-administered medications on file prior to visit.    Review of Systems  Constitutional: Negative for fever and chills.  HENT: Positive for ear discharge and ear pain.       Objective:    BP 118/82 mmHg  Pulse 88  Temp(Src) 97.7 F (36.5 C)  Wt 493 lb (223.623 kg)  SpO2 98% Nursing note and vital signs reviewed.  Physical Exam  Constitutional: He is oriented to person, place, and time. He appears well-developed and well-nourished. No distress.  HENT:  Left Ear: There is drainage and swelling. No tenderness. Decreased hearing is noted.  Cardiovascular: Normal rate, regular rhythm, normal heart sounds and intact distal pulses.   Pulmonary/Chest: Effort normal  and breath sounds normal.  Neurological: He is alert and oriented to person, place, and time.  Skin: Skin is warm and dry.  Psychiatric: He has a normal mood and affect. His behavior is normal. Judgment and thought content normal.       Assessment & Plan:   Problem List Items Addressed This Visit      Nervous and Auditory   Otitis, externa, infective - Primary    Symptoms and exam consistent with otitis externa. Unable to start the Cipro HC. Start Cortisporin. Follow-up if symptoms worsen or fail to improve, or if unable to afford medication.      Relevant Medications   NEOMYCIN-POLYMYXIN-HYDROCORTISONE (CORTISPORIN) 1 % SOLN otic solution

## 2015-04-29 NOTE — Progress Notes (Signed)
Pre visit review using our clinic review tool, if applicable. No additional management support is needed unless otherwise documented below in the visit note. 

## 2015-04-29 NOTE — Assessment & Plan Note (Signed)
Symptoms and exam consistent with otitis externa. Unable to start the Cipro HC. Start Cortisporin. Follow-up if symptoms worsen or fail to improve, or if unable to afford medication.

## 2015-05-09 ENCOUNTER — Emergency Department (HOSPITAL_COMMUNITY)
Admission: EM | Admit: 2015-05-09 | Discharge: 2015-05-09 | Disposition: A | Discharge status: Home / Self Care | Payer: PRIVATE HEALTH INSURANCE | Attending: Emergency Medicine | Admitting: Emergency Medicine

## 2015-05-09 ENCOUNTER — Encounter (HOSPITAL_COMMUNITY): Payer: Self-pay | Admitting: *Deleted

## 2015-05-09 DIAGNOSIS — Z8669 Personal history of other diseases of the nervous system and sense organs: Secondary | ICD-10-CM | POA: Insufficient documentation

## 2015-05-09 DIAGNOSIS — Z7952 Long term (current) use of systemic steroids: Secondary | ICD-10-CM | POA: Diagnosis not present

## 2015-05-09 DIAGNOSIS — T783XXA Angioneurotic edema, initial encounter: Secondary | ICD-10-CM | POA: Insufficient documentation

## 2015-05-09 DIAGNOSIS — L509 Urticaria, unspecified: Secondary | ICD-10-CM | POA: Diagnosis not present

## 2015-05-09 DIAGNOSIS — M47896 Other spondylosis, lumbar region: Secondary | ICD-10-CM | POA: Diagnosis not present

## 2015-05-09 DIAGNOSIS — R21 Rash and other nonspecific skin eruption: Secondary | ICD-10-CM | POA: Diagnosis present

## 2015-05-09 MED ORDER — DIPHENHYDRAMINE HCL 50 MG/ML IJ SOLN
25.0000 mg | Freq: Once | INTRAMUSCULAR | Status: AC
  Administered 2015-05-09: 25 mg via INTRAVENOUS
  Filled 2015-05-09: qty 1

## 2015-05-09 MED ORDER — FAMOTIDINE IN NACL 20-0.9 MG/50ML-% IV SOLN
20.0000 mg | Freq: Once | INTRAVENOUS | Status: AC
  Administered 2015-05-09: 20 mg via INTRAVENOUS
  Filled 2015-05-09: qty 50

## 2015-05-09 MED ORDER — METHYLPREDNISOLONE SODIUM SUCC 125 MG IJ SOLR
125.0000 mg | Freq: Once | INTRAMUSCULAR | Status: AC
  Administered 2015-05-09: 125 mg via INTRAVENOUS
  Filled 2015-05-09: qty 2

## 2015-05-09 MED ORDER — DIPHENHYDRAMINE HCL 25 MG PO TABS: 25.0000 mg | ORAL_TABLET | Freq: Four times a day (QID) | ORAL | Status: DC

## 2015-05-09 MED ORDER — EPINEPHRINE 0.3 MG/0.3ML IJ SOAJ: 0.3000 mg | Freq: Once | INTRAMUSCULAR | Status: DC

## 2015-05-09 MED ORDER — PREDNISONE 20 MG PO TABS: 60.0000 mg | ORAL_TABLET | Freq: Every day | ORAL | Status: DC

## 2015-05-09 MED ORDER — FAMOTIDINE 20 MG PO TABS: 20.0000 mg | ORAL_TABLET | Freq: Two times a day (BID) | ORAL | Status: DC

## 2015-05-09 MED ORDER — EPINEPHRINE 0.3 MG/0.3ML IJ SOAJ
0.3000 mg | Freq: Once | INTRAMUSCULAR | Status: AC
  Administered 2015-05-09: 0.3 mg via INTRAMUSCULAR
  Filled 2015-05-09: qty 0.3

## 2015-05-09 NOTE — ED Provider Notes (Signed)
CSN: [id number]     Arrival date & time 05/09/15  0202 History   First MD Initiated Contact with Patient 05/09/15 0228     This chart was scribed for Marisa Severin, MD by Arlan Organ, ED Scribe. This patient was seen in room A07C/A07C and the patient's care was started 2:58 AM.   Chief Complaint  Patient presents with  . Rash   The history is provided by the patient. No language interpreter was used.    HPI Comments: James Quinn is a 24 y.o. male without any pertinent past medical history who presents to the Emergency Department complaining of constant, ongoing, progressively worsened generalized pruritis rash onset early this morning. Mild, improving lip swelling also reported. No new medications. However, pt admits to new body wash and new laundry detergent. OTC Benadryl attempted prior to arrival without any improvement for symptoms. Last dose at 5 PM yesterday evening. Pt was recently diagnosed with an ear infection and finished Amoxicillin approximately 1 week ago. No recent fever or chills. No trouble swallowing, drooling, or throat/tongue swelling. No known allergies to medications.  Past Medical History  Diagnosis Date  . Arthritis     lower back   History reviewed. No pertinent past surgical history. Family History  Problem Relation Age of Onset  . Arthritis Father   . Diabetes Sister   . Arthritis Maternal Grandmother   . Prostate cancer Maternal Grandfather   . Arthritis Paternal Grandmother    History  Substance Use Topics  . Smoking status: Never Smoker   . Smokeless tobacco: Never Used  . Alcohol Use: Yes     Comment: occ    Review of Systems  Constitutional: Negative for fever and chills.  HENT: Negative for sore throat and trouble swallowing.   Respiratory: Negative for cough and shortness of breath.   Gastrointestinal: Negative for nausea and vomiting.  Skin: Positive for rash.  All other systems reviewed and are negative.     Allergies  Review  of patient's allergies indicates no known allergies.  Home Medications   Prior to Admission medications   Medication Sig Start Date End Date Taking? Authorizing Provider  amoxicillin-clavulanate (AUGMENTIN) 875-125 MG per tablet Take 1 tablet by mouth every 12 (twelve) hours. 04/24/15   Emilia Beck, PA-C  ciprofloxacin-hydrocortisone (CIPRO HC) otic suspension Place 3 drops into the left ear 2 (two) times daily. Patient not taking: Reported on 04/29/2015 04/27/15   Roxy Horseman, PA-C  NEOMYCIN-POLYMYXIN-HYDROCORTISONE (CORTISPORIN) 1 % SOLN otic solution Place 4 drops into the left ear 3 (three) times daily. 04/29/15   Veryl Speak, FNP   Triage Vitals: BP 148/82 mmHg  Pulse 108  Temp(Src) 97.9 F (36.6 C) (Oral)  Resp 18  Wt 498 lb 14.4 oz (226.3 kg)  SpO2 95%   Physical Exam  Constitutional: He is oriented to person, place, and time. He appears well-developed and well-nourished.  HENT:  Head: Normocephalic and atraumatic.  Swelling to lower lip  Eyes: EOM are normal.  Neck: Normal range of motion.  Cardiovascular: Normal rate, regular rhythm, normal heart sounds and intact distal pulses.   Pulmonary/Chest: Effort normal and breath sounds normal. No respiratory distress.  Abdominal: Soft. He exhibits no distension. There is no tenderness.  Musculoskeletal: Normal range of motion.  Neurological: He is alert and oriented to person, place, and time.  Skin: Skin is warm and dry. Rash (diffuse urticaria, arms, legs, chest, abdomen) noted.  Psychiatric: He has a normal mood and affect. Judgment normal.  Nursing note and vitals reviewed.   ED Course  Procedures (including critical care time)  DIAGNOSTIC STUDIES: Oxygen Saturation is 95% on RA, adequate by my interpretation.    COORDINATION OF CARE: 3:04 AM-Discussed treatment plan with pt at bedside and pt agreed to plan.     Labs Review Labs Reviewed - No data to display  Imaging Review No results found.   EKG  Interpretation None      MDM   Final diagnoses:  Urticaria  Angioedema of lips, initial encounter    24 year old male with rash since Thursday.  Patient recently on amoxicillin, but that was over 10 days ago.  Patient feels that a new detergent or soap may be triggering symptoms.  He has some angioedema to his lower lip, but no oral pharynx involvement of shortness of breath, no wheezing, no difficulties speaking or swallowing.  Given extensive hives and angioedema, however, will treat with EpiPen.  Patient is followed by our clinic, will refer to [facility] allergy for further workup.  I personally performed the services described in this documentation, which was scribed in my presence. The recorded information has been reviewed and is accurate.  4:50 AM Patient with significant improvement in hives.  Lip swelling is improved.  Plan to discharge home.  Marisa Severin, MD 05/09/15 [phone]

## 2015-05-09 NOTE — ED Notes (Signed)
The pt has had a rash and itching since yesterday morning.  Tonight the rash continues and his lips are sl swollen.  He has taken benadryl earlier today.  He denies difficulty breathing.  He thinks the itching has increased

## 2015-05-09 NOTE — Discharge Instructions (Signed)
It is recommended you follow-up with her primary care doctor and/or an allergist for further workup of your ongoing symptoms.  Stick to hypoallergenic soaps and detergents.  As you had antibiotics within the past month, there is a small possibility that you may be allergic to penicillin.  It is recommended to get skin testing to further evaluate this.    Allergies Allergies may happen from anything your body is sensitive to. This may be food, medicines, pollens, chemicals, and nearly anything around you in everyday life that produces allergens. An allergen is anything that causes an allergy producing substance. Heredity is often a factor in causing these problems. This means you may have some of the same allergies as your parents. Food allergies happen in all age groups. Food allergies are some of the most severe and life threatening. Some common food allergies are cow's milk, seafood, eggs, nuts, wheat, and soybeans. SYMPTOMS   Swelling around the mouth.  An itchy red rash or hives.  Vomiting or diarrhea.  Difficulty breathing. SEVERE ALLERGIC REACTIONS ARE LIFE-THREATENING. This reaction is called anaphylaxis. It can cause the mouth and throat to swell and cause difficulty with breathing and swallowing. In severe reactions only a trace amount of food (for example, peanut oil in a salad) may cause death within seconds. Seasonal allergies occur in all age groups. These are seasonal because they usually occur during the same season every year. They may be a reaction to molds, grass pollens, or tree pollens. Other causes of problems are house dust mite allergens, pet dander, and mold spores. The symptoms often consist of nasal congestion, a runny itchy nose associated with sneezing, and tearing itchy eyes. There is often an associated itching of the mouth and ears. The problems happen when you come in contact with pollens and other allergens. Allergens are the particles in the air that the body  reacts to with an allergic reaction. This causes you to release allergic antibodies. Through a chain of events, these eventually cause you to release histamine into the blood stream. Although it is meant to be protective to the body, it is this release that causes your discomfort. This is why you were given anti-histamines to feel better. If you are unable to pinpoint the offending allergen, it may be determined by skin or blood testing. Allergies cannot be cured but can be controlled with medicine. Hay fever is a collection of all or some of the seasonal allergy problems. It may often be treated with simple over-the-counter medicine such as diphenhydramine. Take medicine as directed. Do not drink alcohol or drive while taking this medicine. Check with your caregiver or package insert for child dosages. If these medicines are not effective, there are many new medicines your caregiver can prescribe. Stronger medicine such as nasal spray, eye drops, and corticosteroids may be used if the first things you try do not work well. Other treatments such as immunotherapy or desensitizing injections can be used if all else fails. Follow up with your caregiver if problems continue. These seasonal allergies are usually not life threatening. They are generally more of a nuisance that can often be handled using medicine. HOME CARE INSTRUCTIONS   If unsure what causes a reaction, keep a diary of foods eaten and symptoms that follow. Avoid foods that cause reactions.  If hives or rash are present:  Take medicine as directed.  You may use an over-the-counter antihistamine (diphenhydramine) for hives and itching as needed.  Apply cold compresses (cloths) to the skin  or take baths in cool water. Avoid hot baths or showers. Heat will make a rash and itching worse.  If you are severely allergic:  Following a treatment for a severe reaction, hospitalization is often required for closer follow-up.  Wear a medic-alert  bracelet or necklace stating the allergy.  You and your family must learn how to give adrenaline or use an anaphylaxis kit.  If you have had a severe reaction, always carry your anaphylaxis kit or EpiPen with you. Use this medicine as directed by your caregiver if a severe reaction is occurring. Failure to do so could have a fatal outcome. SEEK MEDICAL CARE IF:  You suspect a food allergy. Symptoms generally happen within 30 minutes of eating a food.  Your symptoms have not gone away within 2 days or are getting worse.  You develop new symptoms.  You want to retest yourself or your child with a food or drink you think causes an allergic reaction. Never do this if an anaphylactic reaction to that food or drink has happened before. Only do this under the care of a caregiver. SEEK IMMEDIATE MEDICAL CARE IF:   You have difficulty breathing, are wheezing, or have a tight feeling in your chest or throat.  You have a swollen mouth, or you have hives, swelling, or itching all over your body.  You have had a severe reaction that has responded to your anaphylaxis kit or an EpiPen. These reactions may return when the medicine has worn off. These reactions should be considered life threatening. MAKE SURE YOU:   Understand these instructions.  Will watch your condition.  Will get help right away if you are not doing well or get worse. Document Released: 01/31/2003 Document Revised: 03/04/2013 Document Reviewed: 07/07/2008 [facility] Patient Information 2015 [place], [place]. This information is not intended to replace advice given to you by your health care provider. Make sure you discuss any questions you have with your health care provider.  Angioedema Angioedema is sudden puffiness (swelling), often of the skin. It can happen:  On your face or privates (genitals).  In your belly (abdomen) or other body parts. It usually happens quickly and gets better in 1 or 2 days. It often starts at  night and is found when you wake up. You may get red, itchy patches of skin (hives). Attacks can be dangerous if your breathing passages get puffy. The condition may happen only once, or it can come back at random times. It may happen for several years before it goes away for good. HOME CARE  Only take medicines as told by your doctor.  Always carry your emergency allergy medicines with you.  Wear a medical bracelet as told by your doctor.  Avoid things that you know will cause attacks (triggers). GET HELP IF:  You have another attack.  Your attacks happen more often or get worse.  The condition was passed to you by your parents and you want to have children. GET HELP RIGHT AWAY IF:   Your mouth, tongue, or lips are very puffy.  You have trouble breathing.  You have trouble swallowing.  You pass out (faint). MAKE SURE YOU:   Understand these instructions.  Will watch your condition.  Will get help right away if you are not doing well or get worse. Document Released: 10/26/2009 Document Revised: 08/28/2013 Document Reviewed: 07/01/2013 [facility] Patient Information 2015 [place], [place]. This information is not intended to replace advice given to you by your health care provider. Make sure  you discuss any questions you have with your health care provider.  Hives Hives are itchy, red, swollen areas of the skin. They can vary in size and location on your body. Hives can come and go for hours or several days (acute hives) or for several weeks (chronic hives). Hives do not spread from person to person (noncontagious). They may get worse with scratching, exercise, and emotional stress. CAUSES   Allergic reaction to food, additives, or drugs.  Infections, including the common cold.  Illness, such as vasculitis, lupus, or thyroid disease.  Exposure to sunlight, heat, or cold.  Exercise.  Stress.  Contact with chemicals. SYMPTOMS   Red or white swollen patches on the  skin. The patches may change size, shape, and location quickly and repeatedly.  Itching.  Swelling of the hands, feet, and face. This may occur if hives develop deeper in the skin. DIAGNOSIS  Your caregiver can usually tell what is wrong by performing a physical exam. Skin or blood tests may also be done to determine the cause of your hives. In some cases, the cause cannot be determined. TREATMENT  Mild cases usually get better with medicines such as antihistamines. Severe cases may require an emergency epinephrine injection. If the cause of your hives is known, treatment includes avoiding that trigger.  HOME CARE INSTRUCTIONS   Avoid causes that trigger your hives.  Take antihistamines as directed by your caregiver to reduce the severity of your hives. Non-sedating or low-sedating antihistamines are usually recommended. Do not drive while taking an antihistamine.  Take any other medicines prescribed for itching as directed by your caregiver.  Wear loose-fitting clothing.  Keep all follow-up appointments as directed by your caregiver. SEEK MEDICAL CARE IF:   You have persistent or severe itching that is not relieved with medicine.  You have painful or swollen joints. SEEK IMMEDIATE MEDICAL CARE IF:   You have a fever.  Your tongue or lips are swollen.  You have trouble breathing or swallowing.  You feel tightness in the throat or chest.  You have abdominal pain. These problems may be the first sign of a life-threatening allergic reaction. Call your local emergency services (911 in [place] MAKE SURE YOU:   Understand these instructions.  Will watch your condition.  Will get help right away if you are not doing well or get worse. Document Released: 11/07/2005 Document Revised: 11/12/2013 Document Reviewed: 01/31/2012 [facility] Patient Information 2015 [place], [place]. This information is not intended to replace advice given to you by your health care provider. Make sure you  discuss any questions you have with your health care provider.  Epinephrine Injection Epinephrine is a medicine given by injection to temporarily treat an emergency allergic reaction. It is also used to treat severe asthmatic attacks and other lung problems. The medicine helps to enlarge (dilate) the small breathing tubes of the lungs. A life-threatening, sudden allergic reaction that involves the whole body is called anaphylaxis. Because of potential side effects, epinephrine should only be used as directed by your caregiver. RISKS AND COMPLICATIONS Possible side effects of epinephrine injections include:  Chest pain.  Irregular or rapid heartbeat.  Shortness of breath.  Nausea.  Vomiting.  Abdominal pain or cramping.  Sweating.  Dizziness.  Weakness.  Headache.  Nervousness. Report all side effects to your caregiver. HOW TO GIVE AN EPINEPHRINE INJECTION Give the epinephrine injection immediately when symptoms of a severe reaction begin. Inject the medicine into the outer thigh or any available, large muscle. Your caregiver can  teach you how to do this. You do not need to remove any clothing. After the injection, call your local emergency services (911 in [place] Even if you improve after the injection, you need to be examined at a hospital emergency department. Epinephrine works quickly, but it also wears off quickly. Delayed reactions can occur. A delayed reaction may be as serious and dangerous as the initial reaction. HOME CARE INSTRUCTIONS  Make sure you and your family know how to give an epinephrine injection.  Use epinephrine injections as directed by your caregiver. Do not use this medicine more often or in larger doses than prescribed.  Always carry your epinephrine injection or anaphylaxis kit with you. This can be lifesaving if you have a severe reaction.  Store the medicine in a cool, dry place. If the medicine becomes discolored or cloudy, dispose of it properly  and replace it with new medicine.  Check the expiration date on your medicine. It may be unsafe to use medicines past their expiration date.  Tell your caregiver about any other medicines you are taking. Some medicines can react badly with epinephrine.  Tell your caregiver about any medical conditions you have, such as diabetes, high blood pressure (hypertension), heart disease, irregular heartbeats, or if you are pregnant. SEEK IMMEDIATE MEDICAL CARE IF:  You have used an epinephrine injection. Call your local emergency services (911 in [place] Even if you improve after the injection, you need to be examined at a hospital emergency department to make sure your allergic reaction is under control. You will also be monitored for adverse effects from the medicine.  You have chest pain.  You have irregular or fast heartbeats.  You have shortness of breath.  You have severe headaches.  You have severe nausea, vomiting, or abdominal cramps.  You have severe pain, swelling, or redness in the area where you gave the injection. Document Released: 11/04/2000 Document Revised: 01/30/2012 Document Reviewed: 07/27/2011 [facility] Patient Information 2015 [place], [place]. This information is not intended to replace advice given to you by your health care provider. Make sure you discuss any questions you have with your health care provider.

## 2015-05-12 ENCOUNTER — Ambulatory Visit (INDEPENDENT_AMBULATORY_CARE_PROVIDER_SITE_OTHER): Payer: PRIVATE HEALTH INSURANCE | Admitting: Family

## 2015-05-12 ENCOUNTER — Encounter: Payer: Self-pay | Admitting: Family

## 2015-05-12 VITALS — BP 120/68 | HR 88 | Temp 97.3°F | Resp 18 | Ht 72.0 in | Wt >= 6400 oz

## 2015-05-12 DIAGNOSIS — L259 Unspecified contact dermatitis, unspecified cause: Secondary | ICD-10-CM | POA: Diagnosis not present

## 2015-05-12 MED ORDER — METHYLPREDNISOLONE ACETATE 80 MG/ML IJ SUSP
120.0000 mg | Freq: Once | INTRAMUSCULAR | Status: AC
Start: 2015-05-12 — End: 2015-05-12
  Administered 2015-05-12: 120 mg via INTRAMUSCULAR

## 2015-05-12 MED ORDER — PREDNISONE 10 MG PO TABS: 10.0000 mg | ORAL_TABLET | Freq: Every day | ORAL | Status: DC

## 2015-05-12 MED ORDER — HYDROXYZINE HCL 25 MG PO TABS: 25.0000 mg | ORAL_TABLET | Freq: Three times a day (TID) | ORAL | Status: DC | PRN

## 2015-05-12 NOTE — Progress Notes (Signed)
Subjective:    Patient ID: James Quinn, male    DOB: 1991-05-24, 24 y.o.   MRN: [id number]  Chief Complaint  Patient presents with  . Follow-up    has been having an allergic reaction to something in his house, doesn't know what it is from, still having the issue with the rash    HPI:  James Quinn is a 24 y.o. male with a PMH of morbid obesity who presents today for an office visit following an ED visit.  Recently seen in the ED for a worsening puritic rash that started about a day prior to the visit. Mild, improving lip swelling also reported. No new medications. However, pt admits to new body wash and new laundry detergent. OTC Benadryl attempted prior to arrival without any improvement for symptoms. He was treated with an Epipen and discharged with benedryl and prednisone. All emergency room records and labs have been reviewed in detail.  Reports today with the associated symptom of a rash that is described as red and itchy located on his legs, back, and arms. Notes improvement after being treated with the epi-pen and has yet to start the oral prednisone. Symptoms are severe enough to keep him up at night. Notes that he changed his body wash back to a previously known one and washed the clothing in hot water without detergent.   No Known Allergies  Current Outpatient Prescriptions on File Prior to Visit  Medication Sig Dispense Refill  . amoxicillin-clavulanate (AUGMENTIN) 875-125 MG per tablet Take 1 tablet by mouth every 12 (twelve) hours. (Patient not taking: Reported on 05/09/2015) 14 tablet 0  . ciprofloxacin-hydrocortisone (CIPRO HC) otic suspension Place 3 drops into the left ear 2 (two) times daily. (Patient not taking: Reported on 04/29/2015) 10 mL 0  . diphenhydrAMINE (BENADRYL) 25 MG tablet Take 1 tablet (25 mg total) by mouth every 6 (six) hours. 20 tablet 0  . EPINEPHrine 0.3 mg/0.3 mL IJ SOAJ injection Inject 0.3 mLs (0.3 mg total) into the muscle once. 1  Device 1  . famotidine (PEPCID) 20 MG tablet Take 1 tablet (20 mg total) by mouth 2 (two) times daily. 30 tablet 0  . NEOMYCIN-POLYMYXIN-HYDROCORTISONE (CORTISPORIN) 1 % SOLN otic solution Place 4 drops into the left ear 3 (three) times daily. 10 mL 0   No current facility-administered medications on file prior to visit.    Review of Systems  Constitutional: Negative for fever and chills.  Skin: Positive for rash.      Objective:    BP 120/68 mmHg  Pulse 88  Temp(Src) 97.3 F (36.3 C) (Oral)  Resp 18  Ht 6' (1.829 m)  Wt 502 lb 12.8 oz (228.069 kg)  BMI 68.18 kg/m2  SpO2 94% Nursing note and vital signs reviewed.  Physical Exam  Constitutional: He is oriented to person, place, and time. He appears well-developed and well-nourished. No distress.  Cardiovascular: Normal rate, regular rhythm, normal heart sounds and intact distal pulses.   Pulmonary/Chest: Effort normal and breath sounds normal.  Neurological: He is alert and oriented to person, place, and time.  Skin: Skin is warm and dry.  Diffuse puritic rash noted on arms, legs and back.   Psychiatric: He has a normal mood and affect. His behavior is normal. Judgment and thought content normal.       Assessment & Plan:   Problem List Items Addressed This Visit      Musculoskeletal and Integument   Contact dermatitis - Primary  Symptoms consistent with contact dermatitis of undetermined source. In office injection of Depo-Medrol provided. Start prednisone taper. Start Vistaril as needed for itching. Refer to allergy clinic for testing if needed. Follow-up if symptoms worsen or fail to improve.      Relevant Medications   predniSONE (DELTASONE) 10 MG tablet   hydrOXYzine (ATARAX/VISTARIL) 25 MG tablet   Other Relevant Orders   Ambulatory referral to Allergy

## 2015-05-12 NOTE — Assessment & Plan Note (Signed)
Symptoms consistent with contact dermatitis of undetermined source. In office injection of Depo-Medrol provided. Start prednisone taper. Start Vistaril as needed for itching. Refer to allergy clinic for testing if needed. Follow-up if symptoms worsen or fail to improve.

## 2015-05-12 NOTE — Addendum Note (Signed)
Addended by: Mercer Pod E on: 05/12/2015 04:18 PM   Modules accepted: Orders

## 2015-05-12 NOTE — Patient Instructions (Addendum)
Thank you for choosing [facility].  Summary/Instructions:  Start taking the vistaril as needed for itching.  Your prescription(s) have been submitted to your pharmacy or been printed and provided for you. Please take as directed and contact our office if you believe you are having problem(s) with the medication(s) or have any questions.  If your symptoms worsen or fail to improve, please contact our office for further instruction, or in case of emergency go directly to the emergency room at the closest medical facility.   12 Day Prednisone Taper Instructions   Days 1-4: Two tablets before breakfast, one after lunch, one after dinner, and two at bedtime.  Days 5-8: One tablet before breakfast, one after lunch, one after dinner, and one at bedtime  Days 9-12: One tablet before breakfast and one at bedtime

## 2015-05-18 ENCOUNTER — Ambulatory Visit: Payer: PRIVATE HEALTH INSURANCE | Admitting: Family

## 2015-08-25 ENCOUNTER — Institutional Professional Consult (permissible substitution): Payer: PRIVATE HEALTH INSURANCE | Admitting: Internal Medicine

## 2016-03-18 ENCOUNTER — Encounter: Payer: Self-pay | Admitting: Family

## 2016-03-31 ENCOUNTER — Ambulatory Visit: Payer: PRIVATE HEALTH INSURANCE | Admitting: Family

## 2016-04-28 ENCOUNTER — Encounter: Payer: Self-pay | Admitting: Family

## 2016-04-28 ENCOUNTER — Other Ambulatory Visit (INDEPENDENT_AMBULATORY_CARE_PROVIDER_SITE_OTHER): Payer: PRIVATE HEALTH INSURANCE

## 2016-04-28 ENCOUNTER — Ambulatory Visit (INDEPENDENT_AMBULATORY_CARE_PROVIDER_SITE_OTHER): Payer: PRIVATE HEALTH INSURANCE | Admitting: Family

## 2016-04-28 VITALS — BP 132/82 | HR 67 | Temp 98.5°F | Resp 18 | Ht 72.0 in | Wt >= 6400 oz

## 2016-04-28 DIAGNOSIS — Z0000 Encounter for general adult medical examination without abnormal findings: Secondary | ICD-10-CM | POA: Diagnosis not present

## 2016-04-28 LAB — CBC
HCT: 45.8 % (ref 39.0–52.0)
HEMOGLOBIN: 15.4 g/dL (ref 13.0–17.0)
MCHC: 33.6 g/dL (ref 30.0–36.0)
MCV: 85.4 fl (ref 78.0–100.0)
PLATELETS: 234 10*3/uL (ref 150.0–400.0)
RBC: 5.36 Mil/uL (ref 4.22–5.81)
RDW: 13.6 % (ref 11.5–15.5)
WBC: 7.3 10*3/uL (ref 4.0–10.5)

## 2016-04-28 LAB — COMPREHENSIVE METABOLIC PANEL
ALT: 18 U/L (ref 0–53)
AST: 17 U/L (ref 0–37)
Albumin: 4.4 g/dL (ref 3.5–5.2)
Alkaline Phosphatase: 60 U/L (ref 39–117)
BILIRUBIN TOTAL: 0.3 mg/dL (ref 0.2–1.2)
BUN: 12 mg/dL (ref 6–23)
CALCIUM: 9.5 mg/dL (ref 8.4–10.5)
CO2: 32 mEq/L (ref 19–32)
CREATININE: 1.01 mg/dL (ref 0.40–1.50)
Chloride: 101 mEq/L (ref 96–112)
GFR: 114.05 mL/min (ref >60.00)
GLUCOSE: 87 mg/dL (ref 70–99)
Potassium: 4.2 mEq/L (ref 3.5–5.1)
Sodium: 136 mEq/L (ref 135–145)
TOTAL PROTEIN: 7.7 g/dL (ref 6.0–8.3)

## 2016-04-28 LAB — LIPID PANEL
Cholesterol: 205 mg/dL — ABNORMAL HIGH (ref 0–200)
HDL: 34.1 mg/dL — ABNORMAL LOW (ref >39.00)
LDL CALC: 143 mg/dL — AB (ref 0–99)
NONHDL: 170.56
Total CHOL/HDL Ratio: 6
Triglycerides: 139 mg/dL (ref 0.0–149.0)
VLDL: 27.8 mg/dL (ref 0.0–40.0)

## 2016-04-28 NOTE — Assessment & Plan Note (Signed)
1) Anticipatory Guidance: Discussed importance of wearing a seatbelt while driving and not texting while driving; changing batteries in smoke detector at least once annually; wearing suntan lotion when outside; eating a balanced and moderate diet; getting physical activity at least 30 minutes per day.  2) Immunizations / Screenings / Labs:  All immunizations are up to date per recommendations. Due for a dental and vision exam encouraged to be completed independently. All other screenings are up to date per recommendations. Obtain CBC, CMET, and Lipid profile.  Adequate health exam with risk factors for cardiovascular disease including morbid obesity. He has lost 29 pounds since his last visit with the continued goal of 5-10% additional weight through nutrition and physical activity. Recommend increasing physical activity to 30 minutes of moderate level activity daily. Encourage nutritional intake that focuses on nutrient dense foods and is moderate, varied, and balanced and is low in saturated fats and processed/sugary foods. Continue other healthy lifestyle behaviors and choices. Follow up prevention exam in 1 year. Follow up office visit pending blood work.

## 2016-04-28 NOTE — Progress Notes (Signed)
Pre visit review using our clinic review tool, if applicable. No additional management support is needed unless otherwise documented below in the visit note. 

## 2016-04-28 NOTE — Progress Notes (Signed)
Subjective:    Patient ID: James Quinn, male    DOB: [birth year], 25 y.o.   MRN: [id number]  Chief Complaint  Patient presents with  . CPE    not fasting    HPI:  James Quinn is a 25 y.o. male who presents today for an annual wellness visit.   1) Health Maintenance -   Diet - Averages about 3 meals per day consisting of chicken, fruits, vegetables, brown rice, [place]. Caffeine intake of about 1-2 times per week.   Exercise - 3x per week; cardio and resistance training.    2) Preventative Exams / Immunizations:  Dental -- Due for exam  Vision -- Due for exam   Health Maintenance  Topic Date Due  . HIV Screening  06/07/2004  . INFLUENZA VACCINE  06/21/2016  . TETANUS/TDAP  04/09/2025    Immunization History  Administered Date(s) Administered  . Tdap 04/10/2015   No Known Allergies   Outpatient Prescriptions Prior to Visit  Medication Sig Dispense Refill  . diphenhydrAMINE (BENADRYL) 25 MG tablet Take 1 tablet (25 mg total) by mouth every 6 (six) hours. 20 tablet 0  . famotidine (PEPCID) 20 MG tablet Take 1 tablet (20 mg total) by mouth 2 (two) times daily. 30 tablet 0  . EPINEPHrine 0.3 mg/0.3 mL IJ SOAJ injection Inject 0.3 mLs (0.3 mg total) into the muscle once. 1 Device 1  . hydrOXYzine (ATARAX/VISTARIL) 25 MG tablet Take 1 tablet (25 mg total) by mouth 3 (three) times daily as needed for itching. 30 tablet 0  . NEOMYCIN-POLYMYXIN-HYDROCORTISONE (CORTISPORIN) 1 % SOLN otic solution Place 4 drops into the left ear 3 (three) times daily. 10 mL 0  . predniSONE (DELTASONE) 10 MG tablet Take 1 tablet (10 mg total) by mouth daily with breakfast. 48 tablet 0  . amoxicillin-clavulanate (AUGMENTIN) 875-125 MG per tablet Take 1 tablet by mouth every 12 (twelve) hours. (Patient not taking: Reported on 05/09/2015) 14 tablet 0  . ciprofloxacin-hydrocortisone (CIPRO HC) otic suspension Place 3 drops into the left ear 2 (two) times daily. (Patient not taking:  Reported on 04/29/2015) 10 mL 0   No facility-administered medications prior to visit.     Past Medical History  Diagnosis Date  . Arthritis     lower back     History reviewed. No pertinent past surgical history.   Family History  Problem Relation Age of Onset  . Arthritis Father   . Diabetes Sister   . Arthritis Maternal Grandmother   . Prostate cancer Maternal Grandfather   . Arthritis Paternal Grandmother      Social History   Social History  . Marital Status: Single    Spouse Name: N/A  . Number of Children: 0  . Years of Education: 14   Occupational History  . Engineer, materials    Social History Main Topics  . Smoking status: Never Smoker   . Smokeless tobacco: Never Used  . Alcohol Use: Yes     Comment: occ  . Drug Use: No  . Sexual Activity: Yes   Other Topics Concern  . Not on file   Social History Narrative   Fun: Workout, movies, go out bowling.   Denies religious beliefs effecting healthcare.       Review of Systems  Constitutional: Denies fever, chills, fatigue, or significant weight gain/loss. HENT: Head: Denies headache or neck pain Ears: Denies changes in hearing, ringing in ears, earache, drainage Nose: Denies discharge, stuffiness, itching, nosebleed, sinus pain Throat:  Denies sore throat, hoarseness, dry mouth, sores, thrush Eyes: Denies loss/changes in vision, pain, redness, blurry/double vision, flashing lights Cardiovascular: Denies chest pain/discomfort, tightness, palpitations, shortness of breath with activity, difficulty lying down, swelling, sudden awakening with shortness of breath Respiratory: Denies shortness of breath, cough, sputum production, wheezing Gastrointestinal: Denies dysphasia, heartburn, change in appetite, nausea, change in bowel habits, rectal bleeding, constipation, diarrhea, yellow skin or eyes Genitourinary: Denies frequency, urgency, burning/pain, blood in urine, incontinence, change in urinary  strength. Musculoskeletal: Denies muscle/joint pain, stiffness, back pain, redness or swelling of joints, trauma Skin: Denies rashes, lumps, itching, dryness, color changes, or hair/nail changes Neurological: Denies dizziness, fainting, seizures, weakness, numbness, tingling, tremor Psychiatric - Denies nervousness, stress, depression or memory loss Endocrine: Denies heat or cold intolerance, sweating, frequent urination, excessive thirst, changes in appetite Hematologic: Denies ease of bruising or bleeding     Objective:     BP 132/82 mmHg  Pulse 67  Temp(Src) 98.5 F (36.9 C) (Oral)  Resp 18  Ht 6' (1.829 m)  Wt 486 lb (220.448 kg)  BMI 65.90 kg/m2  SpO2 97% Nursing note and vital signs reviewed.  Wt Readings from Last 3 Encounters:  04/28/16 486 lb (220.448 kg)  05/12/15 502 lb 12.8 oz (228.069 kg)  05/09/15 498 lb 14.4 oz (226.3 kg)    Physical Exam  Constitutional: He is oriented to person, place, and time. He appears well-developed and well-nourished.  HENT:  Head: Normocephalic.  Right Ear: Hearing, tympanic membrane, external ear and ear canal normal.  Left Ear: Hearing, tympanic membrane, external ear and ear canal normal.  Nose: Nose normal.  Mouth/Throat: Uvula is midline, oropharynx is clear and moist and mucous membranes are normal.  Eyes: Conjunctivae and EOM are normal. Pupils are equal, round, and reactive to light.  Neck: Neck supple. No JVD present. No tracheal deviation present. No thyromegaly present.  Cardiovascular: Normal rate, regular rhythm, normal heart sounds and intact distal pulses.   Pulmonary/Chest: Effort normal and breath sounds normal.  Abdominal: Soft. Bowel sounds are normal. He exhibits no distension and no mass. There is no tenderness. There is no rebound and no guarding.  Musculoskeletal: Normal range of motion. He exhibits no edema or tenderness.  Lymphadenopathy:    He has no cervical adenopathy.  Neurological: He is alert and  oriented to person, place, and time. He has normal reflexes. No cranial nerve deficit. He exhibits normal muscle tone. Coordination normal.  Skin: Skin is warm and dry.  Psychiatric: He has a normal mood and affect. His behavior is normal. Judgment and thought content normal.       Assessment & Plan:   Problem List Items Addressed This Visit      Other   Routine general medical examination at a health care facility - Primary    1) Anticipatory Guidance: Discussed importance of wearing a seatbelt while driving and not texting while driving; changing batteries in smoke detector at least once annually; wearing suntan lotion when outside; eating a balanced and moderate diet; getting physical activity at least 30 minutes per day.  2) Immunizations / Screenings / Labs:  All immunizations are up to date per recommendations. Due for a dental and vision exam encouraged to be completed independently. All other screenings are up to date per recommendations. Obtain CBC, CMET, and Lipid profile.  Adequate health exam with risk factors for cardiovascular disease including morbid obesity. He has lost 29 pounds since his last visit with the continued goal of 5-10% additional weight  through nutrition and physical activity. Recommend increasing physical activity to 30 minutes of moderate level activity daily. Encourage nutritional intake that focuses on nutrient dense foods and is moderate, varied, and balanced and is low in saturated fats and processed/sugary foods. Continue other healthy lifestyle behaviors and choices. Follow up prevention exam in 1 year. Follow up office visit pending blood work.        Relevant Orders   CBC (Completed)   Comprehensive metabolic panel (Completed)   Lipid panel (Completed)       I have discontinued Mr. Costlow amoxicillin-clavulanate, ciprofloxacin-hydrocortisone, NEOMYCIN-POLYMYXIN-HYDROCORTISONE, EPINEPHrine, predniSONE, and hydrOXYzine. I am also having him  maintain his diphenhydrAMINE and famotidine.    Follow-up: Return if symptoms worsen or fail to improve.   Jeanine Luz, FNP

## 2016-04-28 NOTE — Patient Instructions (Signed)
Thank you for choosing [facility].  Summary/Instructions:   Please stop by the lab on the basement level of the building for your blood work. Your results will be released to MyChart (or called to you) after review, usually within 72 hours after test completion. If any changes need to be made, you will be notified at that same time.  Congratulations on your weight loss! Keep up the great work.  Health Maintenance, Male A healthy lifestyle and preventative care can promote health and wellness.  Maintain regular health, dental, and eye exams.  Eat a healthy diet. Foods like vegetables, fruits, whole grains, low-fat dairy products, and lean protein foods contain the nutrients you need and are low in calories. Decrease your intake of foods high in solid fats, added sugars, and salt. Get information about a proper diet from your health care provider, if necessary.  Regular physical exercise is one of the most important things you can do for your health. Most adults should get at least 150 minutes of moderate-intensity exercise (any activity that increases your heart rate and causes you to sweat) each week. In addition, most adults need muscle-strengthening exercises on 2 or more days a week.   Maintain a healthy weight. The body mass index (BMI) is a screening tool to identify possible weight problems. It provides an estimate of body fat based on height and weight. Your health care provider can find your BMI and can help you achieve or maintain a healthy weight. For males 20 years and older:  A BMI below 18.5 is considered underweight.  A BMI of 18.5 to 24.9 is normal.  A BMI of 25 to 29.9 is considered overweight.  A BMI of 30 and above is considered obese.  Maintain normal blood lipids and cholesterol by exercising and minimizing your intake of saturated fat. Eat a balanced diet with plenty of fruits and vegetables. Blood tests for lipids and cholesterol should begin at age 27 and  be repeated every 5 years. If your lipid or cholesterol levels are high, you are over age 27, or you are at high risk for heart disease, you may need your cholesterol levels checked more frequently.Ongoing high lipid and cholesterol levels should be treated with medicines if diet and exercise are not working.  If you smoke, find out from your health care provider how to quit. If you do not use tobacco, do not start.  Lung cancer screening is recommended for adults aged 55-80 years who are at high risk for developing lung cancer because of a history of smoking. A yearly low-dose CT scan of the lungs is recommended for people who have at least a 30-pack-year history of smoking and are current smokers or have quit within the past 15 years. A pack year of smoking is smoking an average of 1 pack of cigarettes a day for 1 year (for example, a 30-pack-year history of smoking could mean smoking 1 pack a day for 30 years or 2 packs a day for 15 years). Yearly screening should continue until the smoker has stopped smoking for at least 15 years. Yearly screening should be stopped for people who develop a health problem that would prevent them from having lung cancer treatment.  If you choose to drink alcohol, do not have more than 2 drinks per day. One drink is considered to be 12 oz (360 mL) of beer, 5 oz (150 mL) of wine, or 1.5 oz (45 mL) of liquor.  Avoid the use of street drugs.  Do not share needles with anyone. Ask for help if you need support or instructions about stopping the use of drugs.  High blood pressure causes heart disease and increases the risk of stroke. High blood pressure is more likely to develop in:  People who have blood pressure in the end of the normal range (100-139/85-89 mm Hg).  People who are overweight or obese.  People who are African American.  If you are 41-2 years of age, have your blood pressure checked every 3-5 years. If you are 31 years of age or older, have your blood  pressure checked every year. You should have your blood pressure measured twice--once when you are at a hospital or clinic, and once when you are not at a hospital or clinic. Record the average of the two measurements. To check your blood pressure when you are not at a hospital or clinic, you can use:  An automated blood pressure machine at a pharmacy.  A home blood pressure monitor.  If you are 66-24 years old, ask your health care provider if you should take aspirin to prevent heart disease.  Diabetes screening involves taking a blood sample to check your fasting blood sugar level. This should be done once every 3 years after age 58 if you are at a normal weight and without risk factors for diabetes. Testing should be considered at a younger age or be carried out more frequently if you are overweight and have at least 1 risk factor for diabetes.  Colorectal cancer can be detected and often prevented. Most routine colorectal cancer screening begins at the age of 54 and continues through age 76. However, your health care provider may recommend screening at an earlier age if you have risk factors for colon cancer. On a yearly basis, your health care provider may provide home test kits to check for hidden blood in the stool. A small camera at the end of a tube may be used to directly examine the colon (sigmoidoscopy or colonoscopy) to detect the earliest forms of colorectal cancer. Talk to your health care provider about this at age 24 when routine screening begins. A direct exam of the colon should be repeated every 5-10 years through age 85, unless early forms of precancerous polyps or small growths are found.  People who are at an increased risk for hepatitis B should be screened for this virus. You are considered at high risk for hepatitis B if:  You were born in a country where hepatitis B occurs often. Talk with your health care provider about which countries are considered high risk.  Your  parents were born in a high-risk country and you have not received a shot to protect against hepatitis B (hepatitis B vaccine).  You have HIV or AIDS.  You use needles to inject street drugs.  You live with, or have sex with, someone who has hepatitis B.  You are a man who has sex with other men (MSM).  You get hemodialysis treatment.  You take certain medicines for conditions like cancer, organ transplantation, and autoimmune conditions.  Hepatitis C blood testing is recommended for all people born from [birth year] through 1967 and any individual with known risk factors for hepatitis C.  Healthy men should no longer receive prostate-specific antigen (PSA) blood tests as part of routine cancer screening. Talk to your health care provider about prostate cancer screening.  Testicular cancer screening is not recommended for adolescents or adult males who have no symptoms. Screening includes self-exam,  a health care provider exam, and other screening tests. Consult with your health care provider about any symptoms you have or any concerns you have about testicular cancer.  Practice safe sex. Use condoms and avoid high-risk sexual practices to reduce the spread of sexually transmitted infections (STIs).  You should be screened for STIs, including gonorrhea and chlamydia if:  You are sexually active and are younger than 24 years.  You are older than 24 years, and your health care provider tells you that you are at risk for this type of infection.  Your sexual activity has changed since you were last screened, and you are at an increased risk for chlamydia or gonorrhea. Ask your health care provider if you are at risk.  If you are at risk of being infected with HIV, it is recommended that you take a prescription medicine daily to prevent HIV infection. This is called pre-exposure prophylaxis (PrEP). You are considered at risk if:  You are a man who has sex with other men (MSM).  You are a  heterosexual man who is sexually active with multiple partners.  You take drugs by injection.  You are sexually active with a partner who has HIV.  Talk with your health care provider about whether you are at high risk of being infected with HIV. If you choose to begin PrEP, you should first be tested for HIV. You should then be tested every 3 months for as long as you are taking PrEP.  Use sunscreen. Apply sunscreen liberally and repeatedly throughout the day. You should seek shade when your shadow is shorter than you. Protect yourself by wearing long sleeves, pants, a wide-brimmed hat, and sunglasses year round whenever you are outdoors.  Tell your health care provider of new moles or changes in moles, especially if there is a change in shape or color. Also, tell your health care provider if a mole is larger than the size of a pencil eraser.  A one-time screening for abdominal aortic aneurysm (AAA) and surgical repair of large AAAs by ultrasound is recommended for men aged 27-75 years who are current or former smokers.  Stay current with your vaccines (immunizations).   This information is not intended to replace advice given to you by your health care provider. Make sure you discuss any questions you have with your health care provider.   Document Released: 05/05/2008 Document Revised: 11/28/2014 Document Reviewed: 04/04/2011 Elsevier Interactive Patient Education [employer].

## 2016-05-01 ENCOUNTER — Encounter: Payer: Self-pay | Admitting: Family

## 2016-09-16 ENCOUNTER — Emergency Department (HOSPITAL_COMMUNITY)
Admission: EM | Admit: 2016-09-16 | Discharge: 2016-09-16 | Disposition: A | Discharge status: Home / Self Care | Payer: PRIVATE HEALTH INSURANCE | Source: Home / Self Care | Attending: Emergency Medicine | Admitting: Emergency Medicine

## 2016-09-16 ENCOUNTER — Encounter (HOSPITAL_COMMUNITY): Payer: Self-pay | Admitting: Emergency Medicine

## 2016-09-16 DIAGNOSIS — T7840XA Allergy, unspecified, initial encounter: Secondary | ICD-10-CM

## 2016-09-16 DIAGNOSIS — J039 Acute tonsillitis, unspecified: Secondary | ICD-10-CM | POA: Diagnosis not present

## 2016-09-16 DIAGNOSIS — Z6841 Body Mass Index (BMI) 40.0 and over, adult: Secondary | ICD-10-CM | POA: Diagnosis not present

## 2016-09-16 DIAGNOSIS — M542 Cervicalgia: Secondary | ICD-10-CM | POA: Diagnosis present

## 2016-09-16 DIAGNOSIS — T783XXA Angioneurotic edema, initial encounter: Secondary | ICD-10-CM | POA: Diagnosis not present

## 2016-09-16 DIAGNOSIS — R6510 Systemic inflammatory response syndrome (SIRS) of non-infectious origin without acute organ dysfunction: Secondary | ICD-10-CM | POA: Diagnosis not present

## 2016-09-16 DIAGNOSIS — R1310 Dysphagia, unspecified: Secondary | ICD-10-CM | POA: Diagnosis not present

## 2016-09-16 DIAGNOSIS — X58XXXA Exposure to other specified factors, initial encounter: Secondary | ICD-10-CM | POA: Diagnosis not present

## 2016-09-16 DIAGNOSIS — T782XXA Anaphylactic shock, unspecified, initial encounter: Secondary | ICD-10-CM | POA: Diagnosis not present

## 2016-09-16 MED ORDER — METHYLPREDNISOLONE SODIUM SUCC 125 MG IJ SOLR
125.0000 mg | Freq: Once | INTRAMUSCULAR | Status: AC
  Administered 2016-09-16: 125 mg via INTRAVENOUS
  Filled 2016-09-16: qty 2

## 2016-09-16 MED ORDER — FAMOTIDINE IN NACL 20-0.9 MG/50ML-% IV SOLN
20.0000 mg | Freq: Once | INTRAVENOUS | Status: AC
  Administered 2016-09-16: 20 mg via INTRAVENOUS
  Filled 2016-09-16: qty 50

## 2016-09-16 MED ORDER — PREDNISONE 20 MG PO TABS: 20.0000 mg | ORAL_TABLET | Freq: Two times a day (BID) | ORAL | 0 refills | Status: DC

## 2016-09-16 MED ORDER — DIPHENHYDRAMINE HCL 25 MG PO CAPS: 25.0000 mg | ORAL_CAPSULE | Freq: Four times a day (QID) | ORAL | 0 refills | Status: DC | PRN

## 2016-09-16 MED ORDER — DIPHENHYDRAMINE HCL 50 MG/ML IJ SOLN
25.0000 mg | Freq: Once | INTRAMUSCULAR | Status: AC
  Administered 2016-09-16: 25 mg via INTRAVENOUS
  Filled 2016-09-16: qty 1

## 2016-09-16 NOTE — ED Triage Notes (Addendum)
Pt from home with complaints of oral swelling and rash following eating some cashews. Pt has no known history of allergies, but states once he ate the cashews around 1300 pt began having itching in his hands, legs and face and in his throat. Pt reports he has a tightness in his throat, but pt has clear lung sounds. Pt states he took benadryl around 1430 with short term relief. Pt has a hoarse voice at time of assessment which he states is not his baseline

## 2016-09-16 NOTE — Discharge Instructions (Signed)
Avoid nuts.  Prednisone prescription as well as Benadryl prescription for next 48 hours. Return to the ER with any worsening.

## 2016-09-17 NOTE — ED Provider Notes (Signed)
[facility]-EMERGENCY DEPT Provider Note   CSN: [id number] Arrival date & time: 09/16/16  1845     History   Chief Complaint Chief Complaint  Patient presents with  . Allergic Reaction    HPI James Quinn is a 25 y.o. male. He rates peanuts and cashews tonight. He started feeling that his voice was different in his throat was scratching has a rash on his arm and leg. He took some Benadryl. Saphenous morning. Symptoms persist. He presents here. No difficulty swallowing or speaking. No shortness of breath or stridor. No sig. Lightheadedness.  HPI  Past Medical History:  Diagnosis Date  . Arthritis    lower back    Patient Active Problem List   Diagnosis Date Noted  . Routine general medical examination at a health care facility 04/28/2016  . Contact dermatitis 05/12/2015  . Otitis, externa, infective 04/29/2015  . Morbid obesity (HCC) 04/10/2015    History reviewed. No pertinent surgical history.     Home Medications    Prior to Admission medications   Medication Sig Start Date End Date Taking? Authorizing Provider  naproxen sodium (ANAPROX) 220 MG tablet Take 660 mg by mouth 2 (two) times daily as needed (for pain).   Yes Historical Provider, MD  diphenhydrAMINE (BENADRYL) 25 mg capsule Take 1 capsule (25 mg total) by mouth every 6 (six) hours as needed. 09/16/16   Rolland PorterMark Jatziri Goffredo, MD  predniSONE (DELTASONE) 20 MG tablet Take 1 tablet (20 mg total) by mouth 2 (two) times daily with a meal. 09/16/16   Rolland PorterMark Adonias Demore, MD    Family History Family History  Problem Relation Age of Onset  . Diabetes Sister   . Arthritis Father   . Arthritis Maternal Grandmother   . Prostate cancer Maternal Grandfather   . Arthritis Paternal Grandmother     Social History Social History  Substance Use Topics  . Smoking status: Never Smoker  . Smokeless tobacco: Never Used  . Alcohol use Yes     Comment: occ     Allergies   Review of patient's allergies indicates no known  allergies.   Review of Systems Review of Systems  Constitutional: Negative for appetite change, chills, diaphoresis, fatigue and fever.  HENT: Positive for voice change. Negative for mouth sores, sore throat and trouble swallowing.   Eyes: Negative for visual disturbance.  Respiratory: Negative for cough, chest tightness, shortness of breath and wheezing.   Cardiovascular: Negative for chest pain.  Gastrointestinal: Negative for abdominal distention, abdominal pain, diarrhea, nausea and vomiting.  Endocrine: Negative for polydipsia, polyphagia and polyuria.  Genitourinary: Negative for dysuria, frequency and hematuria.  Musculoskeletal: Negative for gait problem.  Skin: Positive for rash. Negative for color change and pallor.  Neurological: Negative for dizziness, syncope, light-headedness and headaches.  Hematological: Does not bruise/bleed easily.  Psychiatric/Behavioral: Negative for behavioral problems and confusion.     Physical Exam Updated Vital Signs BP 130/80 (BP Location: Left Arm)   Pulse 116   Temp 100.8 F (38.2 C) (Oral)   Resp 20   Ht 6' (1.829 m)   Wt (!) 486 lb (220.4 kg)   SpO2 100%   BMI 65.91 kg/m   Physical Exam  Constitutional: He is oriented to person, place, and time. He appears well-developed and well-nourished. No distress.  Abdomen obese 486 pound male. No acute distress. Laying supine without apparent dyspnea or apprehension.  HENT:  Head: Normocephalic.  Pharynx appears normal. No stridor.  Eyes: Conjunctivae are normal. Pupils are equal, round, and  reactive to light. No scleral icterus.  Neck: Normal range of motion. Neck supple. No thyromegaly present.  Cardiovascular: Normal rate and regular rhythm.  Exam reveals no gallop and no friction rub.   No murmur heard. Pulmonary/Chest: Effort normal and breath sounds normal. No respiratory distress. He has no wheezes. He has no rales.  Abdominal: Soft. Bowel sounds are normal. He exhibits no  distension. There is no tenderness. There is no rebound.  Musculoskeletal: Normal range of motion.  Neurological: He is alert and oriented to person, place, and time.  Skin: Skin is warm and dry. No rash noted.  Erythema of the left thigh  Psychiatric: He has a normal mood and affect. His behavior is normal.     ED Treatments / Results  Labs (all labs ordered are listed, but only abnormal results are displayed) Labs Reviewed - No data to display  EKG  EKG Interpretation None       Radiology No results found.  Procedures Procedures (including critical care time)  Medications Ordered in ED Medications  famotidine (PEPCID) IVPB 20 mg premix (0 mg Intravenous Stopped 09/16/16 2047)  diphenhydrAMINE (BENADRYL) injection 25 mg (25 mg Intravenous Given 09/16/16 2001)  methylPREDNISolone sodium succinate (SOLU-MEDROL) 125 mg/2 mL injection 125 mg (125 mg Intravenous Given 09/16/16 2001)     Initial Impression / Assessment and Plan / ED Course  I have reviewed the triage vital signs and the nursing notes.  Pertinent labs & imaging results that were available during my care of the patient were reviewed by me and considered in my medical decision making (see chart for details).  Clinical Course    Given Benadryl and steroids. Observe for an hour. No additional symptoms. Discharged home. Avoid nuts. Prednisone Benadryl for 48 hours.  Final Clinical Impressions(s) / ED Diagnoses   Final diagnoses:  Allergic reaction, initial encounter    New Prescriptions Discharge Medication List as of 09/16/2016 10:36 PM    START taking these medications   Details  diphenhydrAMINE (BENADRYL) 25 mg capsule Take 1 capsule (25 mg total) by mouth every 6 (six) hours as needed., Starting Fri 09/16/2016, Print    predniSONE (DELTASONE) 20 MG tablet Take 1 tablet (20 mg total) by mouth 2 (two) times daily with a meal., Starting Fri 09/16/2016, Print         Rolland PorterMark Oswaldo Cueto, MD 09/17/16  [phone]

## 2016-09-18 ENCOUNTER — Encounter (HOSPITAL_COMMUNITY): Payer: Self-pay

## 2016-09-18 ENCOUNTER — Emergency Department (HOSPITAL_COMMUNITY): Payer: PRIVATE HEALTH INSURANCE

## 2016-09-18 ENCOUNTER — Observation Stay (HOSPITAL_COMMUNITY)
Admission: EM | Admit: 2016-09-18 | Discharge: 2016-09-21 | DRG: 916 | Disposition: A | Discharge status: Home / Self Care | Payer: PRIVATE HEALTH INSURANCE | Attending: Internal Medicine | Admitting: Internal Medicine

## 2016-09-18 DIAGNOSIS — M542 Cervicalgia: Secondary | ICD-10-CM

## 2016-09-18 DIAGNOSIS — R6510 Systemic inflammatory response syndrome (SIRS) of non-infectious origin without acute organ dysfunction: Secondary | ICD-10-CM | POA: Diagnosis present

## 2016-09-18 DIAGNOSIS — J039 Acute tonsillitis, unspecified: Secondary | ICD-10-CM | POA: Diagnosis present

## 2016-09-18 DIAGNOSIS — L509 Urticaria, unspecified: Secondary | ICD-10-CM | POA: Diagnosis not present

## 2016-09-18 DIAGNOSIS — Z6841 Body Mass Index (BMI) 40.0 and over, adult: Secondary | ICD-10-CM

## 2016-09-18 DIAGNOSIS — T783XXA Angioneurotic edema, initial encounter: Secondary | ICD-10-CM | POA: Diagnosis present

## 2016-09-18 DIAGNOSIS — A419 Sepsis, unspecified organism: Secondary | ICD-10-CM | POA: Diagnosis present

## 2016-09-18 DIAGNOSIS — R1310 Dysphagia, unspecified: Secondary | ICD-10-CM | POA: Diagnosis present

## 2016-09-18 DIAGNOSIS — T782XXA Anaphylactic shock, unspecified, initial encounter: Principal | ICD-10-CM | POA: Diagnosis present

## 2016-09-18 DIAGNOSIS — X58XXXA Exposure to other specified factors, initial encounter: Secondary | ICD-10-CM | POA: Diagnosis present

## 2016-09-18 LAB — I-STAT TROPONIN, ED: Troponin i, poc: 0.01 ng/mL (ref 0.00–0.08)

## 2016-09-18 LAB — I-STAT CHEM 8, ED
BUN: 18 mg/dL (ref 6–20)
CHLORIDE: 105 mmol/L (ref 101–111)
Calcium, Ion: 1.09 mmol/L — ABNORMAL LOW (ref 1.15–1.40)
Creatinine, Ser: 1.2 mg/dL (ref 0.61–1.24)
Glucose, Bld: 172 mg/dL — ABNORMAL HIGH (ref 65–99)
HEMATOCRIT: 50 % (ref 39.0–52.0)
Hemoglobin: 17 g/dL (ref 13.0–17.0)
POTASSIUM: 4.1 mmol/L (ref 3.5–5.1)
SODIUM: 139 mmol/L (ref 135–145)
TCO2: 23 mmol/L (ref 0–100)

## 2016-09-18 LAB — I-STAT CG4 LACTIC ACID, ED
LACTIC ACID, VENOUS: 3.24 mmol/L — AB (ref 0.5–1.9)
LACTIC ACID, VENOUS: 1.55 mmol/L (ref 0.5–1.9)

## 2016-09-18 LAB — BASIC METABOLIC PANEL
Anion gap: 11 (ref 5–15)
BUN: 15 mg/dL (ref 6–20)
CALCIUM: 9.4 mg/dL (ref 8.9–10.3)
CO2: 21 mmol/L — AB (ref 22–32)
CREATININE: 1.24 mg/dL (ref 0.61–1.24)
Chloride: 105 mmol/L (ref 101–111)
GFR calc non Af Amer: >60 mL/min (ref >60)
Glucose, Bld: 169 mg/dL — ABNORMAL HIGH (ref 65–99)
Potassium: 4.1 mmol/L (ref 3.5–5.1)
Sodium: 137 mmol/L (ref 135–145)

## 2016-09-18 LAB — CBC WITH DIFFERENTIAL/PLATELET
BASOS PCT: 0 %
Basophils Absolute: 0 10*3/uL (ref 0.0–0.1)
EOS ABS: 0 10*3/uL (ref 0.0–0.7)
Eosinophils Relative: 0 %
HCT: 48.1 % (ref 39.0–52.0)
HEMOGLOBIN: 16.1 g/dL (ref 13.0–17.0)
Lymphocytes Relative: 23 %
Lymphs Abs: 3.5 10*3/uL (ref 0.7–4.0)
MCH: 29.5 pg (ref 26.0–34.0)
MCHC: 33.5 g/dL (ref 30.0–36.0)
MCV: 88.1 fL (ref 78.0–100.0)
MONO ABS: 1 10*3/uL (ref 0.1–1.0)
MONOS PCT: 6 %
NEUTROS PCT: 71 %
Neutro Abs: 11 10*3/uL — ABNORMAL HIGH (ref 1.7–7.7)
Platelets: 290 10*3/uL (ref 150–400)
RBC: 5.46 MIL/uL (ref 4.22–5.81)
RDW: 13.3 % (ref 11.5–15.5)
WBC: 15.5 10*3/uL — ABNORMAL HIGH (ref 4.0–10.5)

## 2016-09-18 LAB — LACTIC ACID, PLASMA: LACTIC ACID, VENOUS: 2.1 mmol/L — AB (ref 0.5–1.9)

## 2016-09-18 MED ORDER — FAMOTIDINE 20 MG PO TABS
20.0000 mg | ORAL_TABLET | Freq: Two times a day (BID) | ORAL | Status: DC
  Administered 2016-09-18 – 2016-09-21 (×6): 20 mg via ORAL
  Filled 2016-09-18 (×6): qty 1

## 2016-09-18 MED ORDER — SODIUM CHLORIDE 0.9 % IV BOLUS (SEPSIS)
500.0000 mL | Freq: Once | INTRAVENOUS | Status: AC
  Administered 2016-09-19: 500 mL via INTRAVENOUS

## 2016-09-18 MED ORDER — SODIUM CHLORIDE 0.9 % IV SOLN
INTRAVENOUS | Status: DC
  Administered 2016-09-18 – 2016-09-19 (×2): via INTRAVENOUS

## 2016-09-18 MED ORDER — ENOXAPARIN SODIUM 120 MG/0.8ML ~~LOC~~ SOLN
110.0000 mg | SUBCUTANEOUS | Status: DC
  Administered 2016-09-18: 110 mg via SUBCUTANEOUS
  Filled 2016-09-18: qty 0.73

## 2016-09-18 MED ORDER — PIPERACILLIN-TAZOBACTAM 3.375 G IVPB 30 MIN
3.3750 g | Freq: Once | INTRAVENOUS | Status: AC
  Administered 2016-09-18: 3.375 g via INTRAVENOUS
  Filled 2016-09-18: qty 50

## 2016-09-18 MED ORDER — DIPHENHYDRAMINE HCL 25 MG PO CAPS
50.0000 mg | ORAL_CAPSULE | Freq: Once | ORAL | Status: AC
  Administered 2016-09-19: 50 mg via ORAL
  Filled 2016-09-18: qty 2

## 2016-09-18 MED ORDER — PIPERACILLIN-TAZOBACTAM 3.375 G IVPB
3.3750 g | Freq: Three times a day (TID) | INTRAVENOUS | Status: DC
  Administered 2016-09-19: 3.375 g via INTRAVENOUS
  Filled 2016-09-18 (×3): qty 50

## 2016-09-18 MED ORDER — PREDNISONE 20 MG PO TABS: 40.0000 mg | ORAL_TABLET | Freq: Every day | ORAL | Status: DC

## 2016-09-18 MED ORDER — LORATADINE 10 MG PO TABS
10.0000 mg | ORAL_TABLET | Freq: Two times a day (BID) | ORAL | Status: DC
  Administered 2016-09-18 – 2016-09-21 (×6): 10 mg via ORAL
  Filled 2016-09-18 (×6): qty 1

## 2016-09-18 MED ORDER — SODIUM CHLORIDE 0.9 % IV BOLUS (SEPSIS)
1000.0000 mL | Freq: Once | INTRAVENOUS | Status: AC
  Administered 2016-09-18: 1000 mL via INTRAVENOUS

## 2016-09-18 MED ORDER — IOPAMIDOL (ISOVUE-300) INJECTION 61%
INTRAVENOUS | Status: AC
  Administered 2016-09-18: 100 mL
  Filled 2016-09-18: qty 100

## 2016-09-18 MED ORDER — PREDNISONE 20 MG PO TABS
40.0000 mg | ORAL_TABLET | Freq: Every day | ORAL | Status: DC
  Administered 2016-09-19: 40 mg via ORAL
  Filled 2016-09-18: qty 2

## 2016-09-18 MED ORDER — ACETAMINOPHEN 650 MG RE SUPP
650.0000 mg | Freq: Four times a day (QID) | RECTAL | Status: DC | PRN
Start: 2016-09-18 — End: 2016-09-21

## 2016-09-18 MED ORDER — VANCOMYCIN HCL 10 G IV SOLR
1500.0000 mg | Freq: Three times a day (TID) | INTRAVENOUS | Status: DC
  Administered 2016-09-19: 1500 mg via INTRAVENOUS
  Filled 2016-09-18 (×3): qty 1500

## 2016-09-18 MED ORDER — SODIUM CHLORIDE 0.9 % IV BOLUS (SEPSIS)
500.0000 mL | Freq: Once | INTRAVENOUS | Status: AC
  Administered 2016-09-18: 500 mL via INTRAVENOUS

## 2016-09-18 MED ORDER — VANCOMYCIN HCL 10 G IV SOLR
2500.0000 mg | Freq: Once | INTRAVENOUS | Status: AC
  Administered 2016-09-18: 2500 mg via INTRAVENOUS
  Filled 2016-09-18: qty 2500

## 2016-09-18 MED ORDER — ACETAMINOPHEN 325 MG PO TABS
650.0000 mg | ORAL_TABLET | Freq: Four times a day (QID) | ORAL | Status: DC | PRN
Start: 2016-09-18 — End: 2016-09-21
  Administered 2016-09-19 – 2016-09-20 (×4): 650 mg via ORAL
  Filled 2016-09-18 (×4): qty 2

## 2016-09-18 NOTE — ED Triage Notes (Signed)
Pt complaints of swelling at lips and tongue. States difficulty swallowing. Airway intact on assessment. Pt states symptoms of fatigue started today. [facility] reports having similar symptoms on Friday [facility] Pt was sent home on benadryl and steroids. Pt states no improvement from meds.

## 2016-09-18 NOTE — ED Notes (Signed)
Pt to x-ray at this time.  Pt remains alert and oriented.

## 2016-09-18 NOTE — H&P (Signed)
History and Physical  Patient Name: James HurdleChristopher A Weckwerth     ZOX:[id number]    DOB: 1991/09/19    DOA: 09/18/2016 PCP: Jeanine Luzalone, Gregory, FNP   Patient coming from: Home  Chief Complaint: Rash and sore throat  HPI: James HurdleChristopher A Rewis is a 25 y.o. male with a past medical history significant for morbid obesity who presents with acute recurrent urticaria and throat pain.  The patient was in his usual state of health until two days ago when he developed new facial and arm urticaria and throat discomfort with dysphagia but without dizziness, abdominal pain, lip swelling, no stridor, SOB.  He was evaluated in the ER where he was given Solu-Medrol, diphenhydramine and famotidine and his symptoms resolved and he was discharged home.  He took prescribed prednisone and benadryl the next day, felt fine, and went to work.  Then, this morning, he woke again with non-pruritic urticarial rash on his face, pain with swallowing, sore throat, dizziness.  This persisted through the day and so he came back to the ER tonight.  In the waiting room, he syncopized, and was rushed back to a bed for ?anaphylaxis, although his BP was normal on arrival to the room and he had returned to his baseline immediately and so epinephrine was not given.  ED course: -Temp 81F, heart rate 110s, respirations 20s, BP normal, pulse ox normal and respiratory effort appeared comfortable -Na 137, K 4.1, Cr 1.24 (baseline 1.0 in June), WBC 15.5K, Hgb 16.1, lactic acid 3.24 -CODE SEPSIS was not called, he was given 1L NS and TRH were asked to evaluate for urticaria     Before the first episode, he had eaten peanuts and cashews about 1 hour before, but has eaten none since.  Also, starting about one week ago, he began taking naproxen daily for low back pain.  He has taken no naproxen since Friday either.  He takes no other medications.  No other new food exposures that he can think of.  He moved to his current apartment 6 months ago.   Father has an egg allergy, sister had seafood allergies, no other family allergic conditions that he knows of.  He had one day of rhinorrhea on Friday, no other symptoms of URI since, other than sore throat.  Of note, he described to me an episode of rash after using [employer] detergent a year ago, that he said went away when he stopped using the detergent.  Chart review shows actually that he presented to the ER with tachycardia and diffuse urticaria and ?lip swelling, was treated with epinephrine, and symptoms resolved in an hour so he was sent home with Allergy/Immunology follow up.            ROS: Review of Systems  Constitutional: Negative for chills and fever.  HENT: Positive for sore throat. Negative for congestion.   Respiratory: Negative for cough, sputum production, shortness of breath, wheezing and stridor.   Gastrointestinal: Negative for abdominal pain, nausea and vomiting.  Genitourinary: Negative.   Musculoskeletal: Positive for back pain (chronic) and myalgias. Negative for joint pain.  Skin: Positive for rash (urticaria). Negative for itching.  Neurological: Positive for loss of consciousness (once in ER waiting room).  All other systems reviewed and are negative.         Past Medical History:  Diagnosis Date  . Arthritis    lower back    History reviewed. No pertinent surgical history.  Social History: Patient lives alone.  The patient walks  unassisted.  He is employed.  He does not smoke.    No Known Allergies  Family history: family history includes Arthritis in his father, maternal grandmother, and paternal grandmother; Congestive Heart Failure in his sister; Diabetes in his sister; Kidney failure in his sister; Prostate cancer in his maternal grandfather; Stroke in his mother; Thyroid disease in his sister.  Prior to Admission medications   Medication Sig Start Date End Date Taking? Authorizing Provider  diphenhydrAMINE (BENADRYL) 25 mg capsule  Take 1 capsule (25 mg total) by mouth every 6 (six) hours as needed. 09/16/16   Rolland Porter, MD  naproxen sodium (ANAPROX) 220 MG tablet Take 660 mg by mouth 2 (two) times daily as needed (for pain).    Historical Provider, MD  predniSONE (DELTASONE) 20 MG tablet Take 1 tablet (20 mg total) by mouth 2 (two) times daily with a meal. 09/16/16   Rolland Porter, MD       Physical Exam: BP 122/73   Pulse 114   Temp 99.4 F (37.4 C) (Oral)   Resp 25   SpO2 100%  General appearance: Well-developed, obese adult male, alert and in no acute distress.   Eyes: Anicteric, conjunctiva pink, lids and lashes normal. PERRL.   Amblyopia. ENT: No nasal deformity, discharge, epistaxis.  Hearing normal. OP moist without lesions.  No lip swelling, tongue swelling.  Mallampati 4+ Neck: No neck masses.  Trachea midline.  There is tenderness to palpation over the trachea, mildly.  There is no mass appreciated but exam limited by habitus.   Lymph: No cervical or supraclavicular lymphadenopathy. Skin: Warm and dry.  No jaundice.  Urticaria on face, bilaterally.  Reddish, patches on arm, not raised.  Cardiac: Tachycardic, nl S1-S2, no murmurs appreciated.  Capillary refill is brisk.  JVP not visible.  No LE edema.  Radial pulses 2+ and symmetric.  DP pulses diminished. Respiratory: Normal respiratory rate and rhythm.  CTAB without rales or wheezes. Abdomen: Abdomen soft.  No TTP. No ascites, distension, hepatosplenomegaly.   MSK: No deformities or effusions.  No cyanosis or clubbing. Neuro: Cranial nerves normal except amblopia.  Sensation intact to light touch. Speech is fluent.  Muscle strength normal.    Psych: Sensorium intact and responding to questions, attention normal.  Behavior appropriate.  Affect normal.  Judgment and insight appear normal.     Labs on Admission:  I have personally reviewed following labs and imaging studies: CBC:  Recent Labs Lab 09/18/16 1710 09/18/16 1722  WBC 15.5*  --     NEUTROABS 11.0*  --   HGB 16.1 17.0  HCT 48.1 50.0  MCV 88.1  --   PLT 290  --    Basic Metabolic Panel:  Recent Labs Lab 09/18/16 1710 09/18/16 1722  NA 137 139  K 4.1 4.1  CL 105 105  CO2 21*  --   GLUCOSE 169* 172*  BUN 15 18  CREATININE 1.24 1.20  CALCIUM 9.4  --    GFR: Estimated Creatinine Clearance: 176.2 mL/min (by C-G formula based on SCr of 1.2 mg/dL).  Liver Function Tests: No results for input(s): AST, ALT, ALKPHOS, BILITOT, PROT, ALBUMIN in the last 168 hours. No results for input(s): LIPASE, AMYLASE in the last 168 hours. No results for input(s): AMMONIA in the last 168 hours. Coagulation Profile: No results for input(s): INR, PROTIME in the last 168 hours. Cardiac Enzymes: No results for input(s): CKTOTAL, CKMB, CKMBINDEX, TROPONINI in the last 168 hours. BNP (last 3 results) No results for  input(s): PROBNP in the last 8760 hours. HbA1C: No results for input(s): HGBA1C in the last 72 hours. CBG: No results for input(s): GLUCAP in the last 168 hours. Lipid Profile: No results for input(s): CHOL, HDL, LDLCALC, TRIG, CHOLHDL, LDLDIRECT in the last 72 hours. Thyroid Function Tests: No results for input(s): TSH, T4TOTAL, FREET4, T3FREE, THYROIDAB in the last 72 hours. Anemia Panel: No results for input(s): VITAMINB12, FOLATE, FERRITIN, TIBC, IRON, RETICCTPCT in the last 72 hours. Sepsis Labs: Lactic acid 3.24 Invalid input(s): PROCALCITONIN, LACTICIDVEN No results found for this or any previous visit (from the past 240 hour(s)).       Radiological Exams on Admission: Personally reviewed: Dg Neck Soft Tissue  Result Date: 09/18/2016 CLINICAL DATA:  Sore throat. EXAM: NECK SOFT TISSUES - 1+ VIEW COMPARISON:  None. FINDINGS: There is no prevertebral soft tissue swelling. The tongue base and epiglottis appear normal. The visualized portions of the cervical spine demonstrate no significant abnormality. Bifid spinous process at C7. IMPRESSION: Negative.  Electronically Signed   By: Francene BoyersJames  Maxwell M.D.   On: 09/18/2016 18:50   Dg Chest 2 View  Result Date: 09/18/2016 CLINICAL DATA:  Sepsis.  Swelling of the lips and tongue. EXAM: CHEST  2 VIEW 7:58 p.m. COMPARISON:  Chest x-rays dated 09/18/2016 at 5:21 p.m. and 10/06/2008 FINDINGS: The heart size and mediastinal contours are within normal limits. Both lungs are clear. The visualized skeletal structures are unremarkable. IMPRESSION: Normal exam. Electronically Signed   By: Francene BoyersJames  Maxwell M.D.   On: 09/18/2016 20:27   Dg Chest Portable 1 View  Result Date: 09/18/2016 CLINICAL DATA:  Lip and tongue swelling. EXAM: PORTABLE CHEST 1 VIEW COMPARISON:  Radiograph of October 06, 2008. FINDINGS: The heart size and mediastinal contours are within normal limits. No pneumothorax or pleural effusion is noted. Left lung base is not fully included in field-of-view. No acute pulmonary disease is noted. The visualized skeletal structures are unremarkable. IMPRESSION: No acute cardiopulmonary abnormality seen, although left lung base is not included in field-of-view and therefore pathology in this area cannot be excluded. Repeat radiograph is recommended. Electronically Signed   By: Lupita RaiderJames  Green Jr, M.D.   On: 09/18/2016 17:50    EKG: Independently reviewed. Rate 115, QTc 435, early repolarization, no significant ST changes.    Assessment/Plan  1. Possible sepsis:  Patient meets criteria given heart rate, respirations, leukocytosis and elevated lactic acid.  Clinically actually he appears well, and the source of infection is not known.  His only focal discomfort is neck pain, and so I will obtain imaging to rule out retropharyngeal abscess or tracheitis. -CT soft tissues of the neck with and without contrast ordered -Obtain blood cultures -Repeat CXR as recommended by radiology -Obtain UA and urine culture -Empiric vancomycin and Zosyn -In the setting of urticaria and previous history of tachycardia and  urticaria, will have a very low threshold to discontinue Vanc/Zosyn and monitor off antibiotics   2. Urticaria:  Anaphylaxis is not present.  Angioedema is minimal.  Possible triggers include Aleve, recent URI.  Peanuts doubted.  One year ago, episode occurred 1 week after amoxicillin, but he has had no recent antibiotic treatments.  Chronic urticaria is also considered -Fluids for tachycardia -Check RVP -Claritin BID -Famotidine BID -Prednisone, taper over 5 days -Outpatient referral to Allergy          DVT prophylaxis: Lovenox  Code Status: FULL  Family Communication: None presetn  Disposition Plan: Anticipate IV fluids and empiric antibiotics.  Complete workup  for sepsis and have low threshold to stop Abx if urine negative and CT neck negative.  Referral to Allergy for urticaria. Consults called: None Admission status: INPATIENT, med surg         Medical decision making: Patient seen at 7:55 PM on 09/18/2016.  The patient was discussed with Dr. Radford Pax.  What exists of the patient's chart was reviewed in depth and summarized above.  Clinical condition: stable.        KAWIKA BISCHOFF [facility] Pager [phone]

## 2016-09-18 NOTE — Progress Notes (Signed)
CRITICAL VALUE ALERT  Critical value received:  Lactic acid 2.1  Date of notification:  09/18/16  Time of notification:  2312  Critical value read back:Yes.    Nurse who received alert:  Christean GriefIvana Tymel Conely  MD notified (1st page):  Schorr  Time of first page:  2312  MD notified (2nd page):  Time of second page:  Responding MD:  Schorr  Time MD responded:  [phone]

## 2016-09-18 NOTE — Progress Notes (Signed)
Pharmacy Antibiotic Note  James Quinn is a 25 y.o. male admitted on 09/18/2016 with sepsis.  Pharmacy has been consulted for vancomycin and zosyn dosing. Pt is afebrile and WBC is elevated at 14.4. Scr is 1.24. Lactic acid initially elevated but now 1.55.   Plan: Vanc 2500mg  IV x 1 then 1500mg  IV Q8H Zosyn 3.375gm IV Q8H (4 hr inf) F/u renal fxn, C&S, clinical status and trough at SS    Temp (24hrs), Avg:99.4 F (37.4 C), Min:99.4 F (37.4 C), Max:99.4 F (37.4 C)   Recent Labs Lab 09/18/16 1710 09/18/16 1721 09/18/16 1722 09/18/16 1955  WBC 15.5*  --   --   --   CREATININE 1.24  --  1.20  --   LATICACIDVEN  --  3.24*  --  1.55    Estimated Creatinine Clearance: 176.2 mL/min (by C-G formula based on SCr of 1.2 mg/dL).    No Known Allergies  Antimicrobials this admission: Vanc 10/29>> Zosyn 10/29>>  Dose adjustments this admission: N/A  Microbiology results: Pending  Thank you for allowing pharmacy to be a part of this patient's care.  Lenoir Facchini, Drake LeachRachel Lynn 09/18/2016 8:01 PM

## 2016-09-18 NOTE — ED Provider Notes (Signed)
[facility]-EMERGENCY DEPT Provider Note   CSN: [id number] Arrival date & time: 09/18/16  1701     History   Chief Complaint Chief Complaint  Patient presents with  . Allergic Reaction    HPI Clyda HurdleChristopher A Plott is a 25 y.o. male.  HPI Pt complaints of swelling at lips and tongue. States difficulty swallowing. Airway intact on assessment. Pt states symptoms of fatigue started today. Pt reports having similar symptoms on Friday and [facility]. Pt was sent home on benadryl and steroids Past Medical History:  Diagnosis Date  . Arthritis    lower back    Patient Active Problem List   Diagnosis Date Noted  . Urticaria 09/18/2016  . Sepsis, unspecified organism (HCC) 09/18/2016  . Sepsis (HCC) 09/18/2016  . Morbid obesity (HCC) 04/10/2015    History reviewed. No pertinent surgical history.     Home Medications    Prior to Admission medications   Medication Sig Start Date End Date Taking? Authorizing Provider  famotidine (PEPCID) 20 MG tablet Take 1 tablet (20 mg total) by mouth 2 (two) times daily. 09/21/16   Shanker Levora DredgeM Ghimire, MD  loratadine (CLARITIN) 10 MG tablet Take 1 tablet (10 mg total) by mouth daily. 09/21/16   Shanker Levora DredgeM Ghimire, MD  predniSONE (DELTASONE) 10 MG tablet Prednisone 20 mg (2 tabs) daily for 2 days,then, Prednisone 10 mg (1 tab) daily for 1 day and then stop Patient not taking: Reported on 09/23/2016 09/21/16   Shanker Levora DredgeM Ghimire, MD    Family History Family History  Problem Relation Age of Onset  . Diabetes Sister   . Congestive Heart Failure Sister   . Kidney failure Sister   . Thyroid disease Sister   . Arthritis Father   . Arthritis Maternal Grandmother   . Prostate cancer Maternal Grandfather   . Arthritis Paternal Grandmother   . Stroke Mother     Social History Social History  Substance Use Topics  . Smoking status: Never Smoker  . Smokeless tobacco: Never Used  . Alcohol use Yes     Comment: occ     Allergies   Patient  has no known allergies.   Review of Systems Review of Systems  HENT: Positive for sore throat. Negative for voice change.   All other systems reviewed and are negative.    Physical Exam Updated Vital Signs BP (!) 159/72 (BP Location: Left Arm)   Pulse 97   Temp 100.2 F (37.9 C) (Oral)   Resp 18   Ht 6' (1.829 m)   Wt (!) 440 lb (199.6 kg)   SpO2 96%   BMI 59.67 kg/m   Physical Exam  Constitutional: He is oriented to person, place, and time. He appears well-developed and well-nourished. No distress.  HENT:  Head: Normocephalic and atraumatic.  Eyes: Pupils are equal, round, and reactive to light.  Neck: Normal range of motion.  Cardiovascular: Normal rate and intact distal pulses.   Pulmonary/Chest: No respiratory distress. He has no wheezes. He has no rales.  Abdominal: Normal appearance. He exhibits no distension.  Musculoskeletal: Normal range of motion.  Neurological: He is alert and oriented to person, place, and time. No cranial nerve deficit.  Skin: Skin is warm and dry. No rash noted.  Psychiatric: He has a normal mood and affect. His behavior is normal.  Nursing note and vitals reviewed.    ED Treatments / Results  Labs (all labs ordered are listed, but only abnormal results are displayed) Labs Reviewed  URINE CULTURE - Abnormal; Notable for the following:       Result Value   Culture <10,000 COLONIES/mL INSIGNIFICANT GROWTH (*)    All other components within normal limits  BASIC METABOLIC PANEL - Abnormal; Notable for the following:    CO2 21 (*)    Glucose, Bld 169 (*)    All other components within normal limits  CBC WITH DIFFERENTIAL/PLATELET - Abnormal; Notable for the following:    WBC 15.5 (*)    Neutro Abs 11.0 (*)    All other components within normal limits  LACTIC ACID, PLASMA - Abnormal; Notable for the following:    Lactic Acid, Venous 2.1 (*)    All other components within normal limits  BASIC METABOLIC PANEL - Abnormal; Notable for  the following:    CO2 21 (*)    Glucose, Bld 157 (*)    Calcium 8.3 (*)    All other components within normal limits  URINALYSIS, ROUTINE W REFLEX MICROSCOPIC (NOT AT [facility]) - Abnormal; Notable for the following:    Color, Urine AMBER (*)    Hgb urine dipstick MODERATE (*)    Bilirubin Urine SMALL (*)    Protein, ur 30 (*)    All other components within normal limits  CBC - Abnormal; Notable for the following:    WBC 11.0 (*)    All other components within normal limits  URINE MICROSCOPIC-ADD ON - Abnormal; Notable for the following:    Squamous Epithelial / LPF 0-5 (*)    Bacteria, UA RARE (*)    All other components within normal limits  BASIC METABOLIC PANEL - Abnormal; Notable for the following:    Glucose, Bld 187 (*)    Calcium 8.6 (*)    All other components within normal limits  CBC WITH DIFFERENTIAL/PLATELET - Abnormal; Notable for the following:    WBC 13.1 (*)    Hemoglobin 12.5 (*)    HCT 38.6 (*)    Neutro Abs 12.0 (*)    All other components within normal limits  I-STAT CHEM 8, ED - Abnormal; Notable for the following:    Glucose, Bld 172 (*)    Calcium, Ion 1.09 (*)    All other components within normal limits  I-STAT CG4 LACTIC ACID, ED - Abnormal; Notable for the following:    Lactic Acid, Venous 3.24 (*)    All other components within normal limits  CULTURE, BLOOD (ROUTINE X 2)  CULTURE, BLOOD (ROUTINE X 2)  RESPIRATORY PANEL BY PCR  LACTIC ACID, PLASMA  CBC WITH DIFFERENTIAL/PLATELET  I-STAT TROPOININ, ED  I-STAT CG4 LACTIC ACID, ED    EKG  EKG Interpretation  Date/Time:  Sunday September 18 2016 17:26:26 EDT Ventricular Rate:  115 PR Interval:    QRS Duration: 100 QT Interval:  314 QTC Calculation: 435 R Axis:   77 Text Interpretation:  Sinus tachycardia Nonspecific repol abnormality, inferior leads Borderline ST elevation, lateral leads Abnormal ekg Confirmed by Ariannie Penaloza  MD, Shulamit Donofrio ([id number]) on 09/18/2016 7:33:37 PM       Radiology No results  found.  Procedures Procedures (including critical care time)  Medications Ordered in ED Medications  diphenhydrAMINE (BENADRYL) capsule 25 mg (25 mg Oral Not Given 09/20/16 0616)  sodium chloride 0.9 % bolus 1,000 mL (0 mLs Intravenous Stopped 09/18/16 1945)  piperacillin-tazobactam (ZOSYN) IVPB 3.375 g (3.375 g Intravenous New Bag/Given 09/18/16 2114)  vancomycin (VANCOCIN) 2,500 mg in sodium chloride 0.9 % 500 mL IVPB (2,500 mg Intravenous New Bag/Given 09/18/16 2113)  iopamidol (ISOVUE-300) 61 % injection (100 mLs  Contrast Given 09/18/16 2035)  sodium chloride 0.9 % bolus 500 mL (500 mLs Intravenous Given 09/18/16 2246)  diphenhydrAMINE (BENADRYL) capsule 50 mg (50 mg Oral Given 09/19/16 0302)  sodium chloride 0.9 % bolus 500 mL (500 mLs Intravenous Given 09/19/16 0002)  sodium chloride 0.9 % bolus 500 mL (500 mLs Intravenous Given 09/19/16 0200)  ibuprofen (ADVIL,MOTRIN) tablet 400 mg (400 mg Oral Given 09/19/16 0358)     Initial Impression / Assessment and Plan / ED Course  I have reviewed the triage vital signs and the nursing notes.  Pertinent labs & imaging results that were available during my care of the patient were reviewed by me and considered in my medical decision making (see chart for details).  Clinical Course      Final Clinical Impressions(s) / ED Diagnoses   Final diagnoses:  Sepsis (HCC)  Neck pain    New Prescriptions Discharge Medication List as of 09/21/2016 11:48 AM    START taking these medications   Details  famotidine (PEPCID) 20 MG tablet Take 1 tablet (20 mg total) by mouth 2 (two) times daily., Starting Wed 09/21/2016, Print    loratadine (CLARITIN) 10 MG tablet Take 1 tablet (10 mg total) by mouth daily., Starting Wed 09/21/2016, Print    predniSONE (DELTASONE) 10 MG tablet Prednisone 20 mg (2 tabs) daily for 2 days,then, Prednisone 10 mg (1 tab) daily for 1 day and then stop, Print         Nelva Nayobert Adelayde Minney, MD 10/19/16 [phone]

## 2016-09-19 DIAGNOSIS — L509 Urticaria, unspecified: Secondary | ICD-10-CM | POA: Diagnosis not present

## 2016-09-19 DIAGNOSIS — T782XXA Anaphylactic shock, unspecified, initial encounter: Secondary | ICD-10-CM | POA: Diagnosis not present

## 2016-09-19 DIAGNOSIS — R5081 Fever presenting with conditions classified elsewhere: Secondary | ICD-10-CM | POA: Diagnosis not present

## 2016-09-19 LAB — CBC
HEMATOCRIT: 43.9 % (ref 39.0–52.0)
HEMOGLOBIN: 14.4 g/dL (ref 13.0–17.0)
MCH: 28.7 pg (ref 26.0–34.0)
MCHC: 32.8 g/dL (ref 30.0–36.0)
MCV: 87.5 fL (ref 78.0–100.0)
Platelets: 242 10*3/uL (ref 150–400)
RBC: 5.02 MIL/uL (ref 4.22–5.81)
RDW: 13.3 % (ref 11.5–15.5)
WBC: 11 10*3/uL — AB (ref 4.0–10.5)

## 2016-09-19 LAB — RESPIRATORY PANEL BY PCR
Adenovirus: NOT DETECTED
BORDETELLA PERTUSSIS-RVPCR: NOT DETECTED
CORONAVIRUS 229E-RVPPCR: NOT DETECTED
CORONAVIRUS HKU1-RVPPCR: NOT DETECTED
CORONAVIRUS OC43-RVPPCR: NOT DETECTED
Chlamydophila pneumoniae: NOT DETECTED
Coronavirus NL63: NOT DETECTED
INFLUENZA B-RVPPCR: NOT DETECTED
Influenza A: NOT DETECTED
METAPNEUMOVIRUS-RVPPCR: NOT DETECTED
MYCOPLASMA PNEUMONIAE-RVPPCR: NOT DETECTED
PARAINFLUENZA VIRUS 1-RVPPCR: NOT DETECTED
PARAINFLUENZA VIRUS 2-RVPPCR: NOT DETECTED
Parainfluenza Virus 3: NOT DETECTED
Parainfluenza Virus 4: NOT DETECTED
RESPIRATORY SYNCYTIAL VIRUS-RVPPCR: NOT DETECTED
Rhinovirus / Enterovirus: NOT DETECTED

## 2016-09-19 LAB — BASIC METABOLIC PANEL
ANION GAP: 9 (ref 5–15)
BUN: 13 mg/dL (ref 6–20)
CALCIUM: 8.3 mg/dL — AB (ref 8.9–10.3)
CO2: 21 mmol/L — ABNORMAL LOW (ref 22–32)
CREATININE: 1.1 mg/dL (ref 0.61–1.24)
Chloride: 106 mmol/L (ref 101–111)
Glucose, Bld: 157 mg/dL — ABNORMAL HIGH (ref 65–99)
Potassium: 3.7 mmol/L (ref 3.5–5.1)
SODIUM: 136 mmol/L (ref 135–145)

## 2016-09-19 LAB — LACTIC ACID, PLASMA: Lactic Acid, Venous: 1.5 mmol/L (ref 0.5–1.9)

## 2016-09-19 LAB — URINALYSIS, ROUTINE W REFLEX MICROSCOPIC
Glucose, UA: NEGATIVE mg/dL
KETONES UR: NEGATIVE mg/dL
LEUKOCYTES UA: NEGATIVE
NITRITE: NEGATIVE
PH: 6 (ref 5.0–8.0)
PROTEIN: 30 mg/dL — AB
Specific Gravity, Urine: 1.025 (ref 1.005–1.030)

## 2016-09-19 LAB — URINE MICROSCOPIC-ADD ON: WBC, UA: NONE SEEN WBC/hpf (ref 0–5)

## 2016-09-19 MED ORDER — METHYLPREDNISOLONE SODIUM SUCC 40 MG IJ SOLR
40.0000 mg | Freq: Two times a day (BID) | INTRAMUSCULAR | Status: DC
  Administered 2016-09-19 – 2016-09-20 (×2): 40 mg via INTRAVENOUS
  Filled 2016-09-19 (×2): qty 1

## 2016-09-19 MED ORDER — SODIUM CHLORIDE 0.9 % IV BOLUS (SEPSIS)
500.0000 mL | Freq: Once | INTRAVENOUS | Status: AC
  Administered 2016-09-19: 500 mL via INTRAVENOUS

## 2016-09-19 MED ORDER — DIPHENHYDRAMINE HCL 25 MG PO CAPS
25.0000 mg | ORAL_CAPSULE | Freq: Four times a day (QID) | ORAL | Status: AC
  Administered 2016-09-19: 25 mg via ORAL
  Filled 2016-09-19 (×2): qty 1

## 2016-09-19 MED ORDER — ENOXAPARIN SODIUM 100 MG/ML ~~LOC~~ SOLN
100.0000 mg | SUBCUTANEOUS | Status: DC
  Administered 2016-09-19 – 2016-09-20 (×2): 100 mg via SUBCUTANEOUS
  Filled 2016-09-19 (×2): qty 1

## 2016-09-19 MED ORDER — IBUPROFEN 400 MG PO TABS
400.0000 mg | ORAL_TABLET | Freq: Once | ORAL | Status: AC
  Administered 2016-09-19: 400 mg via ORAL
  Filled 2016-09-19: qty 1

## 2016-09-19 NOTE — Progress Notes (Signed)
Bolus completed VS collected, MD notified. MD also notified about temp 100.5, Tylenol PRN cannot be repeated at this time. Pt in no distress.

## 2016-09-19 NOTE — Progress Notes (Signed)
First bolus completed, VS collected, MD notified. Second bolus initiated.

## 2016-09-19 NOTE — Progress Notes (Signed)
Pt arrived to 5 west, ambulated to bed. VS stable, no acute distress noted. Pt oriented to room, instructed on how to use call light and call light within reach.

## 2016-09-19 NOTE — Progress Notes (Signed)
MD notified of second bolus completion and VS.

## 2016-09-19 NOTE — Progress Notes (Addendum)
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 Lactic acid elevated, admitted for impending worsening allergic reaction to rule out sepsis.    Assessment & Plan:   Principal Problem:   Sepsis, unspecified organism [facility]) Active Problems:   Morbid obesity (HCC)   Urticaria   Sepsis (HCC)   1. Type 1 anaphylactic reaction. Persistent hives. Will continue H1, H2 blockers and systemic steroids. Will schedule q 6 hours benadryl. Will need allergy testing as outpatient. Systemic infection is not likely, will dc antibiotic therapy. Change prednisone to methylprednisolone 40 mg IV q12 hours. Chest film personally reviewed negative for infiltrates, will follow cell count in am. Neck CT with no deep infection. Old records from Ed visit 10/27 noted that was treated with benadry and steroids with improvement of his symptoms. Wbc improved from 15 to 11.   2. Obesity. Out of bed as tolerated, will need outpatient follow up. Will consult nutrition to see patient.    DVT prophylaxis: enoxaparin  Code Status: full Family Communication: no family at bedside  Disposition Plan: home   Consultants:     Procedures:    Antimicrobials:    vancomycin dc 10./30  Zosyn dc 10/30   Subjective: Patient with persistent rash, associated with itching, improved sore throat and odinophagia, no nausea or vomiting. No wheezing. Had type 1 anaphylactic reactions in the past.   Objective: Vitals:   09/19/16 0326 09/19/16 0517 09/19/16 0519 09/19/16 0543  BP: (!) 144/78   (!) 133/49  Pulse: (!) 116   (!) 119  Resp: 20   18  Temp: (!) 100.5 F (38.1 C) 98.9 F (37.2 C) 100.3 F (37.9 C) 100.2 F (37.9 C)  TempSrc: Oral Oral Axillary Oral  SpO2:    95%  Weight:      Height:         Intake/Output Summary (Last 24 hours) at 09/19/16 0958 Last data filed at 09/18/16 1945  Gross per 24 hour  Intake             1000 ml  Output                0 ml  Net             1000 ml   Filed Weights   09/18/16 2201  Weight: (!) 199.6 kg (440 lb)    Examination:  General exam: Not in pain or dyspnea.  E ENT: no pallor or icterus.  Respiratory system: No wheezing, rales or rhonchi.  Respiratory effort normal. Cardiovascular system: S1 & S2 heard, RRR. No JVD, murmurs, rubs, gallops or clicks. No pedal edema. Gastrointestinal system: Abdomen is nondistended, soft and nontender. No organomegaly or masses felt. Normal bowel sounds heard. Central nervous system: Alert and oriented. No focal neurological deficits. Extremities: Symmetric 5 x 5 power. Skin: positive hives on the left face and left abdomen. Multiple size, associated erythema.       Data Reviewed: I have personally reviewed following labs and imaging studies  CBC:  Recent Labs Lab 09/18/16 1710 09/18/16 1722 09/19/16 0140  WBC 15.5*  --  11.0*  NEUTROABS 11.0*  --   --   HGB 16.1 17.0 14.4  HCT 48.1 50.0 43.9  MCV 88.1  --  87.5  PLT  290  --  242   Basic Metabolic Panel:  Recent Labs Lab 09/18/16 1710 09/18/16 1722 09/19/16 0140  NA 137 139 136  K 4.1 4.1 3.7  CL 105 105 106  CO2 21*  --  21*  GLUCOSE 169* 172* 157*  BUN 15 18 13   CREATININE 1.24 1.20 1.10  CALCIUM 9.4  --  8.3*   GFR: Estimated Creatinine Clearance: 180.3 mL/min (by C-G formula based on SCr of 1.1 mg/dL). Liver Function Tests: No results for input(s): AST, ALT, ALKPHOS, BILITOT, PROT, ALBUMIN in the last 168 hours. No results for input(s): LIPASE, AMYLASE in the last 168 hours. No results for input(s): AMMONIA in the last 168 hours. Coagulation Profile: No results for input(s): INR, PROTIME in the last 168 hours. Cardiac Enzymes: No results for input(s): CKTOTAL, CKMB, CKMBINDEX, TROPONINI in the last 168  hours. BNP (last 3 results) No results for input(s): PROBNP in the last 8760 hours. HbA1C: No results for input(s): HGBA1C in the last 72 hours. CBG: No results for input(s): GLUCAP in the last 168 hours. Lipid Profile: No results for input(s): CHOL, HDL, LDLCALC, TRIG, CHOLHDL, LDLDIRECT in the last 72 hours. Thyroid Function Tests: No results for input(s): TSH, T4TOTAL, FREET4, T3FREE, THYROIDAB in the last 72 hours. Anemia Panel: No results for input(s): VITAMINB12, FOLATE, FERRITIN, TIBC, IRON, RETICCTPCT in the last 72 hours. Sepsis Labs:  Recent Labs Lab 09/18/16 1721 09/18/16 1955 09/18/16 2202 09/19/16 0140  LATICACIDVEN 3.24* 1.55 2.1* 1.5    No results found for this or any previous visit (from the past 240 hour(s)).       Radiology Studies: Dg Neck Soft Tissue  Result Date: 09/18/2016 CLINICAL DATA:  Sore throat. EXAM: NECK SOFT TISSUES - 1+ VIEW COMPARISON:  None. FINDINGS: There is no prevertebral soft tissue swelling. The tongue base and epiglottis appear normal. The visualized portions of the cervical spine demonstrate no significant abnormality. Bifid spinous process at C7. IMPRESSION: Negative. Electronically Signed   By: Francene BoyersJames  Maxwell M.D.   On: 09/18/2016 18:50   Dg Chest 2 View  Result Date: 09/18/2016 CLINICAL DATA:  Sepsis.  Swelling of the lips and tongue. EXAM: CHEST  2 VIEW 7:58 p.m. COMPARISON:  Chest x-rays dated 09/18/2016 at 5:21 p.m. and 10/06/2008 FINDINGS: The heart size and mediastinal contours are within normal limits. Both lungs are clear. The visualized skeletal structures are unremarkable. IMPRESSION: Normal exam. Electronically Signed   By: Francene BoyersJames  Maxwell M.D.   On: 09/18/2016 20:27   Ct Soft Tissue Neck W Contrast  Result Date: 09/18/2016 CLINICAL DATA:  Swelling of lips and tongue.  Difficulty swallowing. EXAM: CT NECK WITH CONTRAST TECHNIQUE: Multidetector CT imaging of the neck was performed using the standard protocol following  the bolus administration of intravenous contrast. CONTRAST:  1 ISOVUE-300 IOPAMIDOL (ISOVUE-300) INJECTION 61% COMPARISON:  Plain films earlier today. FINDINGS: The exam is limited due to body habitus (BMI 65, weight 486 pounds) , resulting in beam hardening and decreased contrast sensitivity. Pharynx and larynx: BILATERAL tonsillar and adenoidal hypertrophy, without evidence for tonsillar abscess or peritonsillar inflammation. Prominent uvula. Fullness of the retropharyngeal soft tissues, at the C2 level, suggesting a small effusion. No enhancement to suggest retropharyngeal abscess. Normal larynx. Salivary glands: No inflammation, mass, or stone. Thyroid: Normal. Lymph nodes: None enlarged or abnormal density. Vascular: Poor visualization. Limited intracranial: Negative. Visualized orbits: Negative. Mastoids and visualized paranasal sinuses: Chronic mucosal thickening of the LEFT sphenoid. No layering fluid or significant opacity elsewhere. No evidence  for mastoid effusion or middle ear pathology. Skeleton: Poorly visualized.  Suspected mild spondylosis. Upper chest: Limited due to body habitus and beam hardening. No pneumothorax. Other: None. IMPRESSION: Suboptimal exam due to body habitus. Enlarged tonsils and adenoids consistent with a benign inflammatory pharyngitis/tonsillitis. Small retropharyngeal effusion. No pharyngeal abscess or peritonsillar inflammation. Electronically Signed   By: Elsie StainJohn T Curnes M.D.   On: 09/18/2016 21:32   Dg Chest Portable 1 View  Result Date: 09/18/2016 CLINICAL DATA:  Lip and tongue swelling. EXAM: PORTABLE CHEST 1 VIEW COMPARISON:  Radiograph of October 06, 2008. FINDINGS: The heart size and mediastinal contours are within normal limits. No pneumothorax or pleural effusion is noted. Left lung base is not fully included in field-of-view. No acute pulmonary disease is noted. The visualized skeletal structures are unremarkable. IMPRESSION: No acute cardiopulmonary abnormality  seen, although left lung base is not included in field-of-view and therefore pathology in this area cannot be excluded. Repeat radiograph is recommended. Electronically Signed   By: Lupita RaiderJames  Green Jr, M.D.   On: 09/18/2016 17:50        Scheduled Meds: . enoxaparin (LOVENOX) injection  110 mg Subcutaneous Q24H  . famotidine  20 mg Oral BID  . loratadine  10 mg Oral BID  . piperacillin-tazobactam (ZOSYN)  IV  3.375 g Intravenous Q8H  . predniSONE  40 mg Oral Q breakfast  . vancomycin  1,500 mg Intravenous Q8H   Continuous Infusions: . sodium chloride 150 mL/hr at 09/19/16 0807     LOS: 1 day        Coralie KeensMauricio Daniel Karna Abed, MD [facility] Pager [phone]  If 7PM-7AM, please contact night-coverage [email or web address] Password [email or web address] 09/19/2016, 9:58 AM

## 2016-09-20 DIAGNOSIS — R5081 Fever presenting with conditions classified elsewhere: Secondary | ICD-10-CM | POA: Diagnosis not present

## 2016-09-20 DIAGNOSIS — L509 Urticaria, unspecified: Secondary | ICD-10-CM | POA: Diagnosis not present

## 2016-09-20 LAB — URINE CULTURE: Culture: <10000 — AB

## 2016-09-20 LAB — CBC WITH DIFFERENTIAL/PLATELET
BASOS ABS: 0 10*3/uL (ref 0.0–0.1)
BASOS PCT: 0 %
Eosinophils Absolute: 0 10*3/uL (ref 0.0–0.7)
Eosinophils Relative: 0 %
HEMATOCRIT: 38.6 % — AB (ref 39.0–52.0)
Hemoglobin: 12.5 g/dL — ABNORMAL LOW (ref 13.0–17.0)
Lymphocytes Relative: 7 %
Lymphs Abs: 0.9 10*3/uL (ref 0.7–4.0)
MCH: 28.9 pg (ref 26.0–34.0)
MCHC: 32.4 g/dL (ref 30.0–36.0)
MCV: 89.4 fL (ref 78.0–100.0)
MONO ABS: 0.3 10*3/uL (ref 0.1–1.0)
Monocytes Relative: 2 %
NEUTROS ABS: 12 10*3/uL — AB (ref 1.7–7.7)
Neutrophils Relative %: 91 %
PLATELETS: 228 10*3/uL (ref 150–400)
RBC: 4.32 MIL/uL (ref 4.22–5.81)
RDW: 13.5 % (ref 11.5–15.5)
WBC: 13.1 10*3/uL — AB (ref 4.0–10.5)

## 2016-09-20 LAB — BASIC METABOLIC PANEL
ANION GAP: 6 (ref 5–15)
BUN: 10 mg/dL (ref 6–20)
CALCIUM: 8.6 mg/dL — AB (ref 8.9–10.3)
CO2: 25 mmol/L (ref 22–32)
Chloride: 104 mmol/L (ref 101–111)
Creatinine, Ser: 0.94 mg/dL (ref 0.61–1.24)
GLUCOSE: 187 mg/dL — AB (ref 65–99)
Potassium: 3.8 mmol/L (ref 3.5–5.1)
SODIUM: 135 mmol/L (ref 135–145)

## 2016-09-20 MED ORDER — METHYLPREDNISOLONE SODIUM SUCC 40 MG IJ SOLR
20.0000 mg | Freq: Every day | INTRAMUSCULAR | Status: DC
  Administered 2016-09-21: 20 mg via INTRAVENOUS
  Filled 2016-09-20: qty 1

## 2016-09-20 NOTE — Progress Notes (Signed)
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 Lactic acid elevated, admitted for impending worsening allergic reaction to rule out sepsis. Waiting patient to be afebrile for 24 hours before discharge.    Assessment & Plan:   Principal Problem:   Sepsis, unspecified organism [facility](HCC) Active Problems:   Morbid obesity (HCC)   Urticaria   Sepsis (HCC)   1. Type 1 anaphylactic reaction.  Skin lesions have been resolved, patient with fever over last 24 hours, suspected related to cytokine release, will continue to hold on antibiotics and monitor temperature curve for the next 24 hours. Decrease steroid dose, will recommend a rapid taper. Patient will nee allergy testing as outpatient.    2. Obesity. Stable. Follow as outpatient.   DVT prophylaxis: enoxaparin  Code Status: full Family Communication: no family at bedside  Disposition Plan: home   Consultants:     Procedures:    Antimicrobials:    vancomycin dc 10./30  Zosyn dc 10/30  Subjective: Patient has been febrile yesterday, no dyspnea or chest pain, no urinary symptoms, no nausea or vomiting.   Objective: Vitals:   09/19/16 2320 09/20/16 0030 09/20/16 0541 09/20/16 0614  BP: (!) 131/49  (!) 138/53 (!) 138/53  Pulse: (!) 118  91 91  Resp: 18  18 18   Temp: (!) 100.9 F (38.3 C) 100.2 F (37.9 C) 98.9 F (37.2 C) 98.9 F (37.2 C)  TempSrc: Oral Oral Oral Oral  SpO2: 97%  95% 95%  Weight:      Height:       No intake or output data in the 24 hours ending 09/20/16 0923 Filed Weights   09/18/16 2201  Weight: (!) 199.6 kg (440 lb)    Examination:  General exam: not in pain or dyspnea. Respiratory system: Clear to auscultation. Respiratory effort normal. No wheezing, rales or  rhonchi.  Cardiovascular system: S1 & S2 heard, RRR. No JVD, murmurs, rubs, gallops or clicks. No pedal edema. Gastrointestinal system: Abdomen is nondistended, soft and nontender. No organomegaly or masses felt. Normal bowel sounds heard. Central nervous system: Alert and oriented. No focal neurological deficits. Extremities: Symmetric 5 x 5 power. Skin: No rashes, lesions or ulcers, hives and rash has resolved.      Data Reviewed: I have personally reviewed following labs and imaging studies  CBC:  Recent Labs Lab 09/18/16 1710 09/18/16 1722 09/19/16 0140 09/20/16 0356  WBC 15.5*  --  11.0* 13.1*  NEUTROABS 11.0*  --   --  12.0*  HGB 16.1 17.0 14.4 12.5*  HCT 48.1 50.0 43.9 38.6*  MCV 88.1  --  87.5 89.4  PLT 290  --  242 228   Basic Metabolic Panel:  Recent Labs Lab 09/18/16 1710 09/18/16 1722 09/19/16 0140 09/20/16 0356  NA 137 139 136 135  K 4.1 4.1 3.7 3.8  CL 105 105 106 104  CO2 21*  --  21* 25  GLUCOSE 169* 172* 157* 187*  BUN 15 18 13 10   CREATININE 1.24 1.20 1.10 0.94  CALCIUM 9.4  --  8.3* 8.6*   GFR: Estimated Creatinine Clearance: 211 mL/min (by C-G formula based on SCr of 0.94 mg/dL). Liver Function Tests: No results for input(s): AST, ALT, ALKPHOS, BILITOT, PROT, ALBUMIN in the last 168 hours.  No results for input(s): LIPASE, AMYLASE in the last 168 hours. No results for input(s): AMMONIA in the last 168 hours. Coagulation Profile: No results for input(s): INR, PROTIME in the last 168 hours. Cardiac Enzymes: No results for input(s): CKTOTAL, CKMB, CKMBINDEX, TROPONINI in the last 168 hours. BNP (last 3 results) No results for input(s): PROBNP in the last 8760 hours. HbA1C: No results for input(s): HGBA1C in the last 72 hours. CBG: No results for input(s): GLUCAP in the last 168 hours. Lipid Profile: No results for input(s): CHOL, HDL, LDLCALC, TRIG, CHOLHDL, LDLDIRECT in the last 72 hours. Thyroid Function Tests: No results for input(s):  TSH, T4TOTAL, FREET4, T3FREE, THYROIDAB in the last 72 hours. Anemia Panel: No results for input(s): VITAMINB12, FOLATE, FERRITIN, TIBC, IRON, RETICCTPCT in the last 72 hours. Sepsis Labs:  Recent Labs Lab 09/18/16 1721 09/18/16 1955 09/18/16 2202 09/19/16 0140  LATICACIDVEN 3.24* 1.55 2.1* 1.5    Recent Results (from the past 240 hour(s))  Culture, blood (x 2)     Status: None (Preliminary result)   Collection Time: 09/18/16  7:58 PM  Result Value Ref Range Status   Specimen Description BLOOD RIGHT ANTECUBITAL  Final   Special Requests BOTTLES DRAWN AEROBIC AND ANAEROBIC 10CC EA  Final   Culture NO GROWTH < 24 HOURS  Final   Report Status PENDING  Incomplete  Culture, blood (x 2)     Status: None (Preliminary result)   Collection Time: 09/18/16  7:58 PM  Result Value Ref Range Status   Specimen Description BLOOD LEFT HAND  Final   Special Requests IN PEDIATRIC BOTTLE 3CC  Final   Culture NO GROWTH < 24 HOURS  Final   Report Status PENDING  Incomplete  Urine culture     Status: Abnormal   Collection Time: 09/19/16  4:35 AM  Result Value Ref Range Status   Specimen Description URINE, RANDOM  Final   Special Requests ADDED 0535  Final   Culture <10,000 COLONIES/mL INSIGNIFICANT GROWTH (A)  Final   Report Status 09/20/2016 FINAL  Final  Respiratory Panel by PCR     Status: None   Collection Time: 09/19/16  5:35 AM  Result Value Ref Range Status   Adenovirus NOT DETECTED NOT DETECTED Final   Coronavirus 229E NOT DETECTED NOT DETECTED Final   Coronavirus HKU1 NOT DETECTED NOT DETECTED Final   Coronavirus NL63 NOT DETECTED NOT DETECTED Final   Coronavirus OC43 NOT DETECTED NOT DETECTED Final   Metapneumovirus NOT DETECTED NOT DETECTED Final   Rhinovirus / Enterovirus NOT DETECTED NOT DETECTED Final   Influenza A NOT DETECTED NOT DETECTED Final   Influenza B NOT DETECTED NOT DETECTED Final   Parainfluenza Virus 1 NOT DETECTED NOT DETECTED Final   Parainfluenza Virus 2 NOT  DETECTED NOT DETECTED Final   Parainfluenza Virus 3 NOT DETECTED NOT DETECTED Final   Parainfluenza Virus 4 NOT DETECTED NOT DETECTED Final   Respiratory Syncytial Virus NOT DETECTED NOT DETECTED Final   Bordetella pertussis NOT DETECTED NOT DETECTED Final   Chlamydophila pneumoniae NOT DETECTED NOT DETECTED Final   Mycoplasma pneumoniae NOT DETECTED NOT DETECTED Final         Radiology Studies: Dg Neck Soft Tissue  Result Date: 09/18/2016 CLINICAL DATA:  Sore throat. EXAM: NECK SOFT TISSUES - 1+ VIEW COMPARISON:  None. FINDINGS: There is no prevertebral soft tissue swelling. The tongue base and epiglottis appear normal. The visualized portions of the cervical spine demonstrate no significant abnormality. Bifid spinous process at C7.  IMPRESSION: Negative. Electronically Signed   By: Francene Boyers M.D.   On: 09/18/2016 18:50   Dg Chest 2 View  Result Date: 09/18/2016 CLINICAL DATA:  Sepsis.  Swelling of the lips and tongue. EXAM: CHEST  2 VIEW 7:58 p.m. COMPARISON:  Chest x-rays dated 09/18/2016 at 5:21 p.m. and 10/06/2008 FINDINGS: The heart size and mediastinal contours are within normal limits. Both lungs are clear. The visualized skeletal structures are unremarkable. IMPRESSION: Normal exam. Electronically Signed   By: Francene Boyers M.D.   On: 09/18/2016 20:27   Ct Soft Tissue Neck W Contrast  Result Date: 09/18/2016 CLINICAL DATA:  Swelling of lips and tongue.  Difficulty swallowing. EXAM: CT NECK WITH CONTRAST TECHNIQUE: Multidetector CT imaging of the neck was performed using the standard protocol following the bolus administration of intravenous contrast. CONTRAST:  1 ISOVUE-300 IOPAMIDOL (ISOVUE-300) INJECTION 61% COMPARISON:  Plain films earlier today. FINDINGS: The exam is limited due to body habitus (BMI 65, weight 486 pounds) , resulting in beam hardening and decreased contrast sensitivity. Pharynx and larynx: BILATERAL tonsillar and adenoidal hypertrophy, without evidence  for tonsillar abscess or peritonsillar inflammation. Prominent uvula. Fullness of the retropharyngeal soft tissues, at the C2 level, suggesting a small effusion. No enhancement to suggest retropharyngeal abscess. Normal larynx. Salivary glands: No inflammation, mass, or stone. Thyroid: Normal. Lymph nodes: None enlarged or abnormal density. Vascular: Poor visualization. Limited intracranial: Negative. Visualized orbits: Negative. Mastoids and visualized paranasal sinuses: Chronic mucosal thickening of the LEFT sphenoid. No layering fluid or significant opacity elsewhere. No evidence for mastoid effusion or middle ear pathology. Skeleton: Poorly visualized.  Suspected mild spondylosis. Upper chest: Limited due to body habitus and beam hardening. No pneumothorax. Other: None. IMPRESSION: Suboptimal exam due to body habitus. Enlarged tonsils and adenoids consistent with a benign inflammatory pharyngitis/tonsillitis. Small retropharyngeal effusion. No pharyngeal abscess or peritonsillar inflammation. Electronically Signed   By: Elsie Stain M.D.   On: 09/18/2016 21:32   Dg Chest Portable 1 View  Result Date: 09/18/2016 CLINICAL DATA:  Lip and tongue swelling. EXAM: PORTABLE CHEST 1 VIEW COMPARISON:  Radiograph of October 06, 2008. FINDINGS: The heart size and mediastinal contours are within normal limits. No pneumothorax or pleural effusion is noted. Left lung base is not fully included in field-of-view. No acute pulmonary disease is noted. The visualized skeletal structures are unremarkable. IMPRESSION: No acute cardiopulmonary abnormality seen, although left lung base is not included in field-of-view and therefore pathology in this area cannot be excluded. Repeat radiograph is recommended. Electronically Signed   By: Lupita Raider, M.D.   On: 09/18/2016 17:50        Scheduled Meds: . diphenhydrAMINE  25 mg Oral Q6H  . enoxaparin (LOVENOX) injection  100 mg Subcutaneous Q24H  . famotidine  20 mg Oral  BID  . loratadine  10 mg Oral BID  . methylPREDNISolone (SOLU-MEDROL) injection  40 mg Intravenous Q12H   Continuous Infusions:    LOS: 2 days        Amaura Authier Annett Gula, MD [facility] Pager [phone]  If 7PM-7AM, please contact night-coverage [email or web address] Password [email or web address] 09/20/2016, 9:23 AM

## 2016-09-21 DIAGNOSIS — R6510 Systemic inflammatory response syndrome (SIRS) of non-infectious origin without acute organ dysfunction: Secondary | ICD-10-CM | POA: Diagnosis not present

## 2016-09-21 DIAGNOSIS — L509 Urticaria, unspecified: Secondary | ICD-10-CM | POA: Diagnosis not present

## 2016-09-21 MED ORDER — PREDNISONE 10 MG PO TABS: ORAL_TABLET | ORAL | 0 refills | Status: DC

## 2016-09-21 MED ORDER — FAMOTIDINE 20 MG PO TABS: 20.0000 mg | ORAL_TABLET | Freq: Two times a day (BID) | ORAL | 0 refills | Status: DC

## 2016-09-21 MED ORDER — LORATADINE 10 MG PO TABS: 10.0000 mg | ORAL_TABLET | Freq: Every day | ORAL | 0 refills | Status: DC

## 2016-09-21 NOTE — Discharge Summary (Signed)
Feels much better I do not see any hives/rashes on my exam I think he is stable to be discharged home-he did have low-grade fever this morning-could be a delayed cytokine release. Blood cultures, respiratory virus panel negative. CT scan of the neck-shows tonsillitis-but no abscess. Have asked patient to follow up with his PCP in 1 week, and obtain referral to an allergist. See discharge summary for details

## 2016-09-21 NOTE — Discharge Summary (Signed)
PATIENT DETAILS Name: James Quinn Age: 25 y.o. Sex: male Date of Birth: Jan 26, 1991 MRN: [id number]. Admitting Physician: Alberteen Samhristopher P Danford, MD WUJ:[id number]:Calone, Earl LitesGregory, FNP  Admit Date: 09/18/2016 Discharge date: 09/21/2016  Recommendations for Outpatient Follow-up:  1. Follow up with PCP in 1 weeks 2. Please refer patient to allergist  3. Please obtain BMP/CBC in one week   Admitted From:  Home  Disposition: Home    Home Health: No  Equipment/Devices: None  Discharge Condition: Stable  CODE STATUS: Full Code  Diet recommendation:  Heart Healthy  Brief Summary: See H&P, Labs, Consult and Test reports for all details in brief,  25 y.o. male with a past medical history significant for morbid obesity who presented with acute recurrent urticaria and throat pain. Patient was admitted for further evaluation and treatment   Brief Hospital Course: Urticaria: Apparently this is his second episode-prior episode occurred 1 year back. Trigger for urticaria is unknown. He was started on steroids, and antihistamines. By day of discharge he feels much better, no rash or hives seen on exam. No oral/mucosal lesions seen. He feels much better and is requesting discharge, will taper off prednisone over the next few days, continue with antihistamine for the next few days as well. Have asked patient to go to his PCP and get a referral to an allergist.  SIRS: Probably secondary to above, doubt sepsis. Blood cultures, respiratory virus panel negative for infection. He was initially on broad-spectrum antibiotics on admission out of concern for sepsis-but that has since been discontinued. He continues to have intermittent low-grade fevers-but suspect that could be from ongoing/resolving urticaria. He will be discharged home today, as noted above CULTURES are negative.  Obesity: Counseled regarding importance of weight loss. Follow-up with PCP for further  evaluation.  Procedures/Studies: None  Discharge Diagnoses:  Principal Problem:   Sepsis, unspecified organism [facility](HCC) Active Problems:   Morbid obesity (HCC)   Urticaria   Sepsis (HCC)   Discharge Instructions:  Activity:  As tolerated   Discharge Instructions    Call MD for:  hives    Complete by:  As directed    Diet - low sodium heart healthy    Complete by:  As directed    Increase activity slowly    Complete by:  As directed        Medication List    TAKE these medications   famotidine 20 MG tablet Commonly known as:  PEPCID Take 1 tablet (20 mg total) by mouth 2 (two) times daily.   loratadine 10 MG tablet Commonly known as:  CLARITIN Take 1 tablet (10 mg total) by mouth daily.   predniSONE 10 MG tablet Commonly known as:  DELTASONE Prednisone 20 mg (2 tabs) daily for 2 days,then, Prednisone 10 mg (1 tab) daily for 1 day and then stop      Follow-up Information    Jeanine Luzalone, Gregory, FNP. Schedule an appointment as soon as possible for a visit in 1 week(s).   Specialty:  Family Medicine Contact information: [address] [place] [address] [phone]          No Known Allergies   Consultations:   None  Other Procedures/Studies: Dg Neck Soft Tissue  Result Date: 09/18/2016 CLINICAL DATA:  Sore throat. EXAM: NECK SOFT TISSUES - 1+ VIEW COMPARISON:  None. FINDINGS: There is no prevertebral soft tissue swelling. The tongue base and epiglottis appear normal. The visualized portions of the cervical spine demonstrate no significant abnormality. Bifid spinous process at  C7. IMPRESSION: Negative. Electronically Signed   By: Francene Boyers M.D.   On: 09/18/2016 18:50   Dg Chest 2 View  Result Date: 09/18/2016 CLINICAL DATA:  Sepsis.  Swelling of the lips and tongue. EXAM: CHEST  2 VIEW 7:58 p.m. COMPARISON:  Chest x-rays dated 09/18/2016 at 5:21 p.m. and 10/06/2008 FINDINGS: The heart size and mediastinal contours are within normal limits. Both  lungs are clear. The visualized skeletal structures are unremarkable. IMPRESSION: Normal exam. Electronically Signed   By: Francene Boyers M.D.   On: 09/18/2016 20:27   Ct Soft Tissue Neck W Contrast  Result Date: 09/18/2016 CLINICAL DATA:  Swelling of lips and tongue.  Difficulty swallowing. EXAM: CT NECK WITH CONTRAST TECHNIQUE: Multidetector CT imaging of the neck was performed using the standard protocol following the bolus administration of intravenous contrast. CONTRAST:  1 ISOVUE-300 IOPAMIDOL (ISOVUE-300) INJECTION 61% COMPARISON:  Plain films earlier today. FINDINGS: The exam is limited due to body habitus (BMI 65, weight 486 pounds) , resulting in beam hardening and decreased contrast sensitivity. Pharynx and larynx: BILATERAL tonsillar and adenoidal hypertrophy, without evidence for tonsillar abscess or peritonsillar inflammation. Prominent uvula. Fullness of the retropharyngeal soft tissues, at the C2 level, suggesting a small effusion. No enhancement to suggest retropharyngeal abscess. Normal larynx. Salivary glands: No inflammation, mass, or stone. Thyroid: Normal. Lymph nodes: None enlarged or abnormal density. Vascular: Poor visualization. Limited intracranial: Negative. Visualized orbits: Negative. Mastoids and visualized paranasal sinuses: Chronic mucosal thickening of the LEFT sphenoid. No layering fluid or significant opacity elsewhere. No evidence for mastoid effusion or middle ear pathology. Skeleton: Poorly visualized.  Suspected mild spondylosis. Upper chest: Limited due to body habitus and beam hardening. No pneumothorax. Other: None. IMPRESSION: Suboptimal exam due to body habitus. Enlarged tonsils and adenoids consistent with a benign inflammatory pharyngitis/tonsillitis. Small retropharyngeal effusion. No pharyngeal abscess or peritonsillar inflammation. Electronically Signed   By: Elsie Stain M.D.   On: 09/18/2016 21:32   Dg Chest Portable 1 View  Result Date:  09/18/2016 CLINICAL DATA:  Lip and tongue swelling. EXAM: PORTABLE CHEST 1 VIEW COMPARISON:  Radiograph of October 06, 2008. FINDINGS: The heart size and mediastinal contours are within normal limits. No pneumothorax or pleural effusion is noted. Left lung base is not fully included in field-of-view. No acute pulmonary disease is noted. The visualized skeletal structures are unremarkable. IMPRESSION: No acute cardiopulmonary abnormality seen, although left lung base is not included in field-of-view and therefore pathology in this area cannot be excluded. Repeat radiograph is recommended. Electronically Signed   By: Lupita Raider, M.D.   On: 09/18/2016 17:50      TODAY-DAY OF DISCHARGE:  Subjective:   James Quinn today has no headache,no chest abdominal pain,no new weakness tingling or numbness, feels much better wants to go home today.   Objective:   Blood pressure (!) 159/72, pulse 97, temperature (!) 100.7 F (38.2 C), temperature source Oral, resp. rate 18, height 6' (1.829 m), weight (!) 199.6 kg (440 lb), SpO2 96 %. No intake or output data in the 24 hours ending 09/21/16 1103 Filed Weights   09/18/16 2201  Weight: (!) 199.6 kg (440 lb)    Exam: Awake Alert, Oriented *3, No new F.N deficits, Normal affect [place].AT,PERRAL Supple Neck,No JVD, No cervical lymphadenopathy appriciated.  Symmetrical Chest wall movement, Good air movement bilaterally, CTAB RRR,No Gallops,Rubs or new Murmurs, No Parasternal Heave +ve B.Sounds, Abd Soft, Non tender, No organomegaly appriciated, No rebound -guarding or rigidity. No Cyanosis, Clubbing or  edema, No new Rash or bruise   PERTINENT RADIOLOGIC STUDIES: Dg Neck Soft Tissue  Result Date: 09/18/2016 CLINICAL DATA:  Sore throat. EXAM: NECK SOFT TISSUES - 1+ VIEW COMPARISON:  None. FINDINGS: There is no prevertebral soft tissue swelling. The tongue base and epiglottis appear normal. The visualized portions of the cervical spine demonstrate  no significant abnormality. Bifid spinous process at C7. IMPRESSION: Negative. Electronically Signed   By: Francene Boyers M.D.   On: 09/18/2016 18:50   Dg Chest 2 View  Result Date: 09/18/2016 CLINICAL DATA:  Sepsis.  Swelling of the lips and tongue. EXAM: CHEST  2 VIEW 7:58 p.m. COMPARISON:  Chest x-rays dated 09/18/2016 at 5:21 p.m. and 10/06/2008 FINDINGS: The heart size and mediastinal contours are within normal limits. Both lungs are clear. The visualized skeletal structures are unremarkable. IMPRESSION: Normal exam. Electronically Signed   By: Francene Boyers M.D.   On: 09/18/2016 20:27   Ct Soft Tissue Neck W Contrast  Result Date: 09/18/2016 CLINICAL DATA:  Swelling of lips and tongue.  Difficulty swallowing. EXAM: CT NECK WITH CONTRAST TECHNIQUE: Multidetector CT imaging of the neck was performed using the standard protocol following the bolus administration of intravenous contrast. CONTRAST:  1 ISOVUE-300 IOPAMIDOL (ISOVUE-300) INJECTION 61% COMPARISON:  Plain films earlier today. FINDINGS: The exam is limited due to body habitus (BMI 65, weight 486 pounds) , resulting in beam hardening and decreased contrast sensitivity. Pharynx and larynx: BILATERAL tonsillar and adenoidal hypertrophy, without evidence for tonsillar abscess or peritonsillar inflammation. Prominent uvula. Fullness of the retropharyngeal soft tissues, at the C2 level, suggesting a small effusion. No enhancement to suggest retropharyngeal abscess. Normal larynx. Salivary glands: No inflammation, mass, or stone. Thyroid: Normal. Lymph nodes: None enlarged or abnormal density. Vascular: Poor visualization. Limited intracranial: Negative. Visualized orbits: Negative. Mastoids and visualized paranasal sinuses: Chronic mucosal thickening of the LEFT sphenoid. No layering fluid or significant opacity elsewhere. No evidence for mastoid effusion or middle ear pathology. Skeleton: Poorly visualized.  Suspected mild spondylosis. Upper chest:  Limited due to body habitus and beam hardening. No pneumothorax. Other: None. IMPRESSION: Suboptimal exam due to body habitus. Enlarged tonsils and adenoids consistent with a benign inflammatory pharyngitis/tonsillitis. Small retropharyngeal effusion. No pharyngeal abscess or peritonsillar inflammation. Electronically Signed   By: Elsie Stain M.D.   On: 09/18/2016 21:32   Dg Chest Portable 1 View  Result Date: 09/18/2016 CLINICAL DATA:  Lip and tongue swelling. EXAM: PORTABLE CHEST 1 VIEW COMPARISON:  Radiograph of October 06, 2008. FINDINGS: The heart size and mediastinal contours are within normal limits. No pneumothorax or pleural effusion is noted. Left lung base is not fully included in field-of-view. No acute pulmonary disease is noted. The visualized skeletal structures are unremarkable. IMPRESSION: No acute cardiopulmonary abnormality seen, although left lung base is not included in field-of-view and therefore pathology in this area cannot be excluded. Repeat radiograph is recommended. Electronically Signed   By: Lupita Raider, M.D.   On: 09/18/2016 17:50     PERTINENT LAB RESULTS: CBC:  Recent Labs  09/19/16 0140 09/20/16 0356  WBC 11.0* 13.1*  HGB 14.4 12.5*  HCT 43.9 38.6*  PLT 242 228   CMET CMP     Component Value Date/Time   NA 135 09/20/2016 0356   K 3.8 09/20/2016 0356   CL 104 09/20/2016 0356   CO2 25 09/20/2016 0356   GLUCOSE 187 (H) 09/20/2016 0356   BUN 10 09/20/2016 0356   CREATININE 0.94 09/20/2016 0356   CALCIUM  8.6 (L) 09/20/2016 0356   PROT 7.7 04/28/2016 1559   ALBUMIN 4.4 04/28/2016 1559   AST 17 04/28/2016 1559   ALT 18 04/28/2016 1559   ALKPHOS 60 04/28/2016 1559   BILITOT 0.3 04/28/2016 1559   GFRNONAA >60 09/20/2016 0356   GFRAA >60 09/20/2016 0356    GFR Estimated Creatinine Clearance: 211 mL/min (by C-G formula based on SCr of 0.94 mg/dL). No results for input(s): LIPASE, AMYLASE in the last 72 hours. No results for input(s):  CKTOTAL, CKMB, CKMBINDEX, TROPONINI in the last 72 hours. Invalid input(s): POCBNP No results for input(s): DDIMER in the last 72 hours. No results for input(s): HGBA1C in the last 72 hours. No results for input(s): CHOL, HDL, LDLCALC, TRIG, CHOLHDL, LDLDIRECT in the last 72 hours. No results for input(s): TSH, T4TOTAL, T3FREE, THYROIDAB in the last 72 hours.  Invalid input(s): FREET3 No results for input(s): VITAMINB12, FOLATE, FERRITIN, TIBC, IRON, RETICCTPCT in the last 72 hours. Coags: No results for input(s): INR in the last 72 hours.  Invalid input(s): PT Microbiology: Recent Results (from the past 240 hour(s))  Culture, blood (x 2)     Status: None (Preliminary result)   Collection Time: 09/18/16  7:58 PM  Result Value Ref Range Status   Specimen Description BLOOD RIGHT ANTECUBITAL  Final   Special Requests BOTTLES DRAWN AEROBIC AND ANAEROBIC 10CC EA  Final   Culture NO GROWTH 2 DAYS  Final   Report Status PENDING  Incomplete  Culture, blood (x 2)     Status: None (Preliminary result)   Collection Time: 09/18/16  7:58 PM  Result Value Ref Range Status   Specimen Description BLOOD LEFT HAND  Final   Special Requests IN PEDIATRIC BOTTLE 3CC  Final   Culture NO GROWTH 2 DAYS  Final   Report Status PENDING  Incomplete  Urine culture     Status: Abnormal   Collection Time: 09/19/16  4:35 AM  Result Value Ref Range Status   Specimen Description URINE, RANDOM  Final   Special Requests ADDED [id number]  Final   Culture <10,000 COLONIES/mL INSIGNIFICANT GROWTH (A)  Final   Report Status 09/20/2016 FINAL  Final  Respiratory Panel by PCR     Status: None   Collection Time: 09/19/16  5:35 AM  Result Value Ref Range Status   Adenovirus NOT DETECTED NOT DETECTED Final   Coronavirus 229E NOT DETECTED NOT DETECTED Final   Coronavirus HKU1 NOT DETECTED NOT DETECTED Final   Coronavirus NL63 NOT DETECTED NOT DETECTED Final   Coronavirus OC43 NOT DETECTED NOT DETECTED Final   Metapneumovirus  NOT DETECTED NOT DETECTED Final   Rhinovirus / Enterovirus NOT DETECTED NOT DETECTED Final   Influenza A NOT DETECTED NOT DETECTED Final   Influenza B NOT DETECTED NOT DETECTED Final   Parainfluenza Virus 1 NOT DETECTED NOT DETECTED Final   Parainfluenza Virus 2 NOT DETECTED NOT DETECTED Final   Parainfluenza Virus 3 NOT DETECTED NOT DETECTED Final   Parainfluenza Virus 4 NOT DETECTED NOT DETECTED Final   Respiratory Syncytial Virus NOT DETECTED NOT DETECTED Final   Bordetella pertussis NOT DETECTED NOT DETECTED Final   Chlamydophila pneumoniae NOT DETECTED NOT DETECTED Final   Mycoplasma pneumoniae NOT DETECTED NOT DETECTED Final    FURTHER DISCHARGE INSTRUCTIONS:  Get Medicines reviewed and adjusted: Please take all your medications with you for your next visit with your Primary MD  Laboratory/radiological data: Please request your Primary MD to go over all hospital tests and procedure/radiological results at  the follow up, please ask your Primary MD to get all Hospital records sent to his/her office.  In some cases, they will be blood work, cultures and biopsy results pending at the time of your discharge. Please request that your primary care M.D. goes through all the records of your hospital data and follows up on these results.  Also Note the following: If you experience worsening of your admission symptoms, develop shortness of breath, life threatening emergency, suicidal or homicidal thoughts you must seek medical attention immediately by calling 911 or calling your MD immediately  if symptoms less severe.  You must read complete instructions/literature along with all the possible adverse reactions/side effects for all the Medicines you take and that have been prescribed to you. Take any new Medicines after you have completely understood and accpet all the possible adverse reactions/side effects.   Do not drive when taking Pain medications or sleeping medications  (Benzodaizepines)  Do not take more than prescribed Pain, Sleep and Anxiety Medications. It is not advisable to combine anxiety,sleep and pain medications without talking with your primary care practitioner  Special Instructions: If you have smoked or chewed Tobacco  in the last 2 yrs please stop smoking, stop any regular Alcohol  and or any Recreational drug use.  Wear Seat belts while driving.  Please note: You were cared for by a hospitalist during your hospital stay. Once you are discharged, your primary care physician will handle any further medical issues. Please note that NO REFILLS for any discharge medications will be authorized once you are discharged, as it is imperative that you return to your primary care physician (or establish a relationship with a primary care physician if you do not have one) for your post hospital discharge needs so that they can reassess your need for medications and monitor your lab values.  Total Time spent coordinating discharge including counseling, education and face to face time equals 35 minutes.  SignedJeoffrey Massed 09/21/2016 11:03 AM

## 2016-09-21 NOTE — Progress Notes (Signed)
Patient given discharge instructions and prescriptions, verbalized understanding, IV removed, follow up appointment scheduled.   Casper HarrisonSamantha K Tamra Koos, RN

## 2016-09-21 NOTE — Progress Notes (Signed)
Brief Nutrition Note   RD received consult for diet education for obesity. Pt with active discharge orders and about to be released from hospital.   If pt would like further counseling on weight loss, he could benefit from referral from [facility] ([facility] for further support.   Cherisse Carrell A. Mayford KnifeWilliams, RD, LDN, CDE Pager: [phone] After hours Pager: [phone]

## 2016-09-21 NOTE — Care Management Note (Signed)
Case Management Note  Patient Details  Name: James Quinn MRN: [id number] Date of Birth: [birth year]  Subjective/Objective:         Admitted with sepsis, history of type 1 allergic reactions. Independent with ADL's and no DME usage PTA. Resides with family.  PCP: Jeanine LuzGregory Calone   Action/Plan: Plan is to d/c to home today with 1 week f/u with PCP.  Expected Discharge Date:    09/21/2016           Expected Discharge Plan:     In-House Referral:     Discharge planning Services      Status of Service:   COMPLETED  If discussed at Long Length of Stay Meetings, dates discussed:    Additional Comments:  Epifanio LeschesCole, Nan Maya Hudson, RN 09/21/2016, 11:14 AM

## 2016-09-22 NOTE — Progress Notes (Signed)
Subjective:    Patient ID: James Quinn, male    DOB: 1991/10/13, 25 y.o.   MRN: [id number]  Chief Complaint  Patient presents with  . Urticaria    HPI:  James Quinn is a 25 y.o. male who  has a past medical history of Arthritis. and presents today for an office follow up.  Recently evaluated in the ED and admitted to the hospital with the chief complaint of lip/tongue swelling and difficulty swallowing. During a prior visit he was treated with Benedryl and steroids. He was noted to have a non-pruritic rash. While in the waiting room he experienced an episode of syncope and was emergently brought back with concern for anaphylaxis. Vital signs were normal upon examination. Prior to the first episode, he consumed peanuts and cashews. There was also concern for naproxen allergy. He was diagnosed with urticaria with an unknown trigger. He was treated with steroids and antihistamine and noted to feel better with no further rashes on exam. Also experience SIRS secondary to the urticaria.  Originally started on antibiotics however were discontinued with negative cultures. Hospitalist recommends follow up BMP and CBC with referral to allergist. All ED and hospital records, labs, and imaging reviewed in detail.  Since leaving the hospital he continues to take his medications as prescribed and denies adverse side effects. Notes his symptoms have gradually improved with no further evidence of rash or itching. Does continue to experience associated symptom of mild hand swelling of undetermined cause. There is minimal to no pain associated with it. Denies any trauma. There are no modifying factors that make it better or worse. Did have IV in both arms. Unsure if the hand swelling was prior to his urticaria symptoms.   No Known Allergies    Outpatient Medications Prior to Visit  Medication Sig Dispense Refill  . famotidine (PEPCID) 20 MG tablet Take 1 tablet (20 mg total) by mouth 2  (two) times daily. 20 tablet 0  . loratadine (CLARITIN) 10 MG tablet Take 1 tablet (10 mg total) by mouth daily. 10 tablet 0  . predniSONE (DELTASONE) 10 MG tablet Prednisone 20 mg (2 tabs) daily for 2 days,then, Prednisone 10 mg (1 tab) daily for 1 day and then stop (Patient not taking: Reported on 09/23/2016) 5 tablet 0   No facility-administered medications prior to visit.       No past surgical history on file.    Past Medical History:  Diagnosis Date  . Arthritis    lower back      Review of Systems  Constitutional: Negative for chills and fever.  Respiratory: Negative for chest tightness and shortness of breath.   Cardiovascular: Negative for chest pain, palpitations and leg swelling.  Skin: Negative for rash.      Objective:    BP 112/68 (BP Location: Left Arm, Patient Position: Sitting, Cuff Size: Large)   Pulse (!) 113   Temp 99.4 F (37.4 C) (Oral)   Ht 6' (1.829 m)   Wt (!) 490 lb 12 oz (222.6 kg)   SpO2 97%   BMI 66.56 kg/m  Nursing note and vital signs reviewed.  Physical Exam  Constitutional: He is oriented to person, place, and time. He appears well-developed and well-nourished. No distress.  Cardiovascular: Normal rate, regular rhythm, normal heart sounds and intact distal pulses.   Pulmonary/Chest: Effort normal and breath sounds normal.  Neurological: He is alert and oriented to person, place, and time.  Skin: Skin is warm and dry.  Psychiatric: He has a normal mood and affect. His behavior is normal. Judgment and thought content normal.       Assessment & Plan:   Problem List Items Addressed This Visit      Musculoskeletal and Integument   Urticaria - Primary    Symptoms of urticaria appear resolved with no further episodes of itchiness or rashes. Obtain CBC and basic metabolic panel. Continue current dosage of Claritin and famotidine as needed. Referral to allergy specialist placed. Follow-up if symptoms worsen or return.      Relevant  Orders   Basic Metabolic Panel (BMET)   CBC   Ambulatory referral to Allergy    Other Visit Diagnoses   None.      I am having Mr. James Quinn maintain his famotidine, predniSONE, and loratadine.   Follow-up: Return if symptoms worsen or fail to improve.  Jeanine Luzalone, Raffaella Edison, FNP

## 2016-09-23 ENCOUNTER — Other Ambulatory Visit (INDEPENDENT_AMBULATORY_CARE_PROVIDER_SITE_OTHER): Payer: PRIVATE HEALTH INSURANCE

## 2016-09-23 ENCOUNTER — Ambulatory Visit (INDEPENDENT_AMBULATORY_CARE_PROVIDER_SITE_OTHER): Payer: PRIVATE HEALTH INSURANCE | Admitting: Family

## 2016-09-23 ENCOUNTER — Encounter: Payer: Self-pay | Admitting: Family

## 2016-09-23 ENCOUNTER — Telehealth: Payer: Self-pay | Admitting: *Deleted

## 2016-09-23 VITALS — BP 112/68 | HR 113 | Temp 99.4°F | Ht 72.0 in | Wt >= 6400 oz

## 2016-09-23 DIAGNOSIS — L509 Urticaria, unspecified: Secondary | ICD-10-CM | POA: Diagnosis not present

## 2016-09-23 DIAGNOSIS — D72829 Elevated white blood cell count, unspecified: Secondary | ICD-10-CM

## 2016-09-23 LAB — CULTURE, BLOOD (ROUTINE X 2)
Culture: NO GROWTH
Culture: NO GROWTH

## 2016-09-23 LAB — CBC
HEMATOCRIT: 41.7 % (ref 39.0–52.0)
HEMOGLOBIN: 14.4 g/dL (ref 13.0–17.0)
MCHC: 34.5 g/dL (ref 30.0–36.0)
MCV: 84.4 fl (ref 78.0–100.0)
Platelets: 342 10*3/uL (ref 150.0–400.0)
RBC: 4.94 Mil/uL (ref 4.22–5.81)
RDW: 13.5 % (ref 11.5–15.5)
WBC: 25.6 10*3/uL (ref 4.0–10.5)

## 2016-09-23 LAB — BASIC METABOLIC PANEL
BUN: 16 mg/dL (ref 6–23)
CHLORIDE: 100 meq/L (ref 96–112)
CO2: 27 mEq/L (ref 19–32)
CREATININE: 1.02 mg/dL (ref 0.40–1.50)
Calcium: 9 mg/dL (ref 8.4–10.5)
GFR: 112.42 mL/min (ref >60.00)
GLUCOSE: 163 mg/dL — AB (ref 70–99)
POTASSIUM: 4.1 meq/L (ref 3.5–5.1)
Sodium: 134 mEq/L — ABNORMAL LOW (ref 135–145)

## 2016-09-23 NOTE — Progress Notes (Signed)
Pre visit review using our clinic review tool, if applicable. No additional management support is needed unless otherwise documented below in the visit note. 

## 2016-09-23 NOTE — Assessment & Plan Note (Signed)
Symptoms of urticaria appear resolved with no further episodes of itchiness or rashes. Obtain CBC and basic metabolic panel. Continue current dosage of Claritin and famotidine as needed. Referral to allergy specialist placed. Follow-up if symptoms worsen or return.

## 2016-09-23 NOTE — Telephone Encounter (Signed)
Pt's WBCs are critical at 25.6 [facility]. PCP verbally informed.

## 2016-09-23 NOTE — Patient Instructions (Addendum)
Thank you for choosing [facility].  SUMMARY AND INSTRUCTIONS:   Referrals:  They will call to schedule your appointment with the allergy specialist.   Referrals have been made during this visit. You should expect to hear back from our schedulers in about 7-10 days in regards to establishing an appointment with the specialists we discussed.   Follow up:  If your symptoms worsen or fail to improve, please contact our office for further instruction, or in case of emergency go directly to the emergency room at the closest medical facility.     Hives Hives are itchy, red, swollen areas of the skin. They can vary in size and location on your body. Hives can come and go for hours or several days (acute hives) or for several weeks (chronic hives). Hives do not spread from person to person (noncontagious). They may get worse with scratching, exercise, and emotional stress. CAUSES   Allergic reaction to food, additives, or drugs.  Infections, including the common cold.  Illness, such as vasculitis, lupus, or thyroid disease.  Exposure to sunlight, heat, or cold.  Exercise.  Stress.  Contact with chemicals. SYMPTOMS   Red or white swollen patches on the skin. The patches may change size, shape, and location quickly and repeatedly.  Itching.  Swelling of the hands, feet, and face. This may occur if hives develop deeper in the skin. DIAGNOSIS  Your caregiver can usually tell what is wrong by performing a physical exam. Skin or blood tests may also be done to determine the cause of your hives. In some cases, the cause cannot be determined. TREATMENT  Mild cases usually get better with medicines such as antihistamines. Severe cases may require an emergency epinephrine injection. If the cause of your hives is known, treatment includes avoiding that trigger.  HOME CARE INSTRUCTIONS   Avoid causes that trigger your hives.  Take antihistamines as directed by your caregiver to  reduce the severity of your hives. Non-sedating or low-sedating antihistamines are usually recommended. Do not drive while taking an antihistamine.  Take any other medicines prescribed for itching as directed by your caregiver.  Wear loose-fitting clothing.  Keep all follow-up appointments as directed by your caregiver. SEEK MEDICAL CARE IF:   You have persistent or severe itching that is not relieved with medicine.  You have painful or swollen joints. SEEK IMMEDIATE MEDICAL CARE IF:   You have a fever.  Your tongue or lips are swollen.  You have trouble breathing or swallowing.  You feel tightness in the throat or chest.  You have abdominal pain. These problems may be the first sign of a life-threatening allergic reaction. Call your local emergency services (911 in [place] MAKE SURE YOU:   Understand these instructions.  Will watch your condition.  Will get help right away if you are not doing well or get worse.   This information is not intended to replace advice given to you by your health care provider. Make sure you discuss any questions you have with your health care provider.   Document Released: 11/07/2005 Document Revised: 11/12/2013 Document Reviewed: 01/31/2012 Elsevier Interactive Patient Education [employer]2016 Elsevier Inc.

## 2016-09-25 ENCOUNTER — Encounter: Payer: Self-pay | Admitting: Family

## 2016-09-25 NOTE — Telephone Encounter (Signed)
Recheck WBC as patient with no significant symptoms upon exam. Further treatment pending recheck.

## 2016-09-26 ENCOUNTER — Inpatient Hospital Stay: Payer: PRIVATE HEALTH INSURANCE | Admitting: Family

## 2016-10-31 ENCOUNTER — Ambulatory Visit (INDEPENDENT_AMBULATORY_CARE_PROVIDER_SITE_OTHER): Payer: PRIVATE HEALTH INSURANCE | Admitting: Allergy

## 2016-10-31 ENCOUNTER — Encounter: Payer: Self-pay | Admitting: Allergy

## 2016-10-31 VITALS — BP 136/84 | HR 67 | Temp 98.8°F | Resp 16 | Ht 70.0 in | Wt >= 6400 oz

## 2016-10-31 DIAGNOSIS — T7840XD Allergy, unspecified, subsequent encounter: Secondary | ICD-10-CM | POA: Diagnosis not present

## 2016-10-31 MED ORDER — EPINEPHRINE 0.3 MG/0.3ML IJ SOAJ: 0.3000 mg | Freq: Once | INTRAMUSCULAR | 0 refills | Status: AC

## 2016-10-31 NOTE — Progress Notes (Signed)
New Patient Note  RE: James HurdleChristopher A Velie MRN: [id number] DOB: 09-02-1991 Date of Office Visit: 10/31/2016  Referring provider: Veryl Speakalone, Gregory D, FNP Primary care provider: Jeanine Luzalone, Gregory, FNP  Chief Complaint: allergic reaction  History of present illness: James Quinn is a 25 y.o. male presenting today for consultation for allergic reaction.  He reports he had an allergic reaction towards the end of October 17.   He recalls eating peanuts, cashews and a small bag of pork rinds a hour before symptoms developed of feet and hand swelling.  He states he had not eaten cashews in a very long time. Since the reaction he has continued to eat peanut butter without any problems but he has been avoiding cashews.  He also feels like he had increase mucus and nasal congestion as well as sore throat with the swelling.   He thought maybe he was getting sick with a cold as to why he had the sore throat.  He does report his arms were red and he had hives on his thighs.  He denies any trouble breathing.  He does report he was told he "got lightheaded".  He presented to the ED and was admitted for 3 days.     He also reports he was taking aleve.    Per review of his hospital stay from 09/18/16-09/21/16.  h=He presented to the ED frombhome due to the hives and throat pain as well as extremity swelling. It was noted that he did not have any dizziness, abdominal pain, lip swelling, stridor or shortness of breath. In the ER solumedrol and Benadryl as well as famotidine and he was sent home.  Per the record he reports he woke the next day with continued rash as well as pain with swallowing, sore throat and dizziness.  In the waiting room he syncopized and was taken back in the ED for ?anaphylaxis although his BP was normal.  He had a quick retrun to baseline so no epinephrine was given.  He had a temperature of 99 with remaining vitals are normal. He hasn't labs done showing a WBC of 15.5 and a lactic acid  3.24 thus there was concern for sepsis.  he had blood cultures, RVP were done and were negative. He was initially on broad spectrum antibiotics until sepsis was ruled out and then discontinued.  He was started on steroids and antihistamines and sent home with a prednisone taper.   He has eating peanut butter since the reaction and has not have any symptoms.   He has a previous reaction in Aug 2016 where again syptoms started while at work.  This episode he reports he only had hives.   He denies having hives outside of these 2 episodes.   He recalls going to doctor for eval and received steroids and was back to normal in few days.    He has not history of food allergy, asthma.    He reports having dry skin that's worse in the winter.  He reports having eczema as a child.  He currently only uses vaseline.     Review of systems: Review of Systems  Constitutional: Negative for chills, fever, malaise/fatigue and weight loss.  HENT: Negative for congestion, nosebleeds, sinus pain and sore throat.   Eyes: Negative for discharge and redness.  Respiratory: Negative for cough, shortness of breath and wheezing.   Cardiovascular: Negative for chest pain.  Gastrointestinal: Negative for heartburn, nausea and vomiting.  Skin: Negative for itching and rash.  All other systems negative unless noted above in HPI  Past medical history: Past Medical History:  Diagnosis Date  . Arthritis    lower back  . Eczema     Past surgical history: No past surgical history on file.  Family history:  Family History  Problem Relation Age of Onset  . Diabetes Sister   . Congestive Heart Failure Sister   . Kidney failure Sister   . Thyroid disease Sister   . Arthritis Father   . Arthritis Maternal Grandmother   . Prostate cancer Maternal Grandfather   . Arthritis Paternal Grandmother   . Stroke Mother   dad with egg allergy Sister with salmon allergy  Social history:  He live in an apartment with  carpeting with electric heating and central cooling.  There are cats and dogs outside the home.  No concern for water damage or mildew or roaches in the home.  He does not smoke.  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          Medication List:   Medication List       Accurate as of 10/31/16  5:15 PM. Always use your most recent med list.          EPINEPHrine 0.3 mg/0.3 mL Soaj injection Commonly known as:  EPIPEN 2-PAK Inject 0.3 mLs (0.3 mg total) into the muscle once.   famotidine 20 MG tablet Commonly known as:  PEPCID Take 1 tablet (20 mg total) by mouth 2 (two) times daily.   loratadine 10 MG tablet Commonly known as:  CLARITIN Take 1 tablet (10 mg total) by mouth daily.       Known medication allergies: No Known Allergies   Physical examination: Blood pressure 136/84, pulse 67, temperature 98.8 F (37.1 C), temperature source Oral, resp. rate 16, height 5\' 10"  (1.778 m), weight (!) 500 lb 6.4 oz (227 kg), SpO2 97 %.  General: Alert, interactive, in no acute distress. obese HEENT: TMs pearly gray, turbinates non-edematous without discharge, post-pharynx non erythematous. Neck: Supple without lymphadenopathy. Lungs: Clear to auscultation without wheezing, rhonchi or rales. {no increased work of breathing. CV: Normal S1, S2 without murmurs. Abdomen: Nondistended, nontender. Skin: dry patch over nose. Extremities:  No clubbing, cyanosis or edema. Neuro:   Grossly intact.  Diagnositics/Labs  Allergy testing:skin prick testing for peanut, tree nuts and pork were negative Allergy testing results were read and interpreted by provider, documented by clinical staff.   Assessment and plan:   Allergic reaction   -symptoms do appear to be consistent with an allergic reaction given onset of symptoms following ingestion of nuts and pork.  However, it appears per the hospital records that symptoms persistents and/or returned the next day prompting his ED visit.    This could be consistent with mammalian meat allergen as symptoms are typically delayed by several hours.  Also concerned for possible mast cell issues as this is second reaction without a very clear trigger and will obtain tryptase level as below.    - Food allergy testing for nuts and pork were negative.   - Will obtain serum IgE levels for nut panel and alpha-gal panel as well as tryptase level.    - Will prescribe Epipen 0.3mg  for as needed use in case of recurrent allergic reaction.  Follow your emergency action plan.   - At this time would continue avoidance of tree nuts and red meats until labs return  - if labs return unremarkable then recommend he take zyrtec  10mg  daily as well as zantac 150mg  BID to help decrease any further reactions  Follow-up 4-6 months   I appreciate the opportunity to take part in Adden's care. Please do not hesitate to contact me with questions.  Sincerely,   Margo Aye, MD Allergy/Immunology [facility]

## 2016-10-31 NOTE — Patient Instructions (Addendum)
Your symptoms appear most consistent with an allergic reaction.  Food allergy testing for nuts and pork were negative.   Will obtain serum IgE levels for nut panel and alpha-gal panel as well as tryptase level.    Will prescribe Epipen 0.3mg  for as needed use in case of recurrent allergic reaction.  Follow your emergency action plan.   At this time would continue avoidance of tree nuts and red meats until labs return  Follow-up 4-6 months

## 2016-11-01 LAB — TRYPTASE: TRYPTASE: 3.3 ug/L (ref <11)

## 2016-11-01 LAB — ALLERGY PANEL 18, NUT MIX GROUP
Hazelnut: 0.29 kU/L — ABNORMAL HIGH
Pecan Nut: <0.1 kU/L
Sesame Seed f10: 0.69 kU/L — ABNORMAL HIGH

## 2016-11-01 LAB — ALLERGEN, BRAZIL NUT, F18

## 2016-11-01 LAB — ALLERGEN WALNUT F256: Walnut: <0.1 kU/L

## 2016-11-03 LAB — ALPHA-GAL PANEL
CLASS: 0
Class: 0
Class: 0
Galactose-alpha-1,3-galactose IgE*: <0.1 kU/L (ref <0.35)
Pork IgE: <0.1 kU/L (ref <0.35)

## 2017-01-16 ENCOUNTER — Ambulatory Visit: Payer: Self-pay | Admitting: Family

## 2017-03-09 ENCOUNTER — Encounter (HOSPITAL_COMMUNITY): Payer: Self-pay | Admitting: Family Medicine

## 2017-03-09 ENCOUNTER — Ambulatory Visit (HOSPITAL_COMMUNITY)
Admission: EM | Admit: 2017-03-09 | Discharge: 2017-03-09 | Disposition: A | Discharge status: Home / Self Care | Payer: PRIVATE HEALTH INSURANCE | Attending: Family Medicine | Admitting: Family Medicine

## 2017-03-09 DIAGNOSIS — J302 Other seasonal allergic rhinitis: Secondary | ICD-10-CM | POA: Diagnosis not present

## 2017-03-09 DIAGNOSIS — L700 Acne vulgaris: Secondary | ICD-10-CM

## 2017-03-09 MED ORDER — MONTELUKAST SODIUM 10 MG PO TABS: 10.0000 mg | ORAL_TABLET | Freq: Every day | ORAL | 2 refills | Status: DC

## 2017-03-09 NOTE — ED Triage Notes (Signed)
Pt here for sore or bump to right nose since Saturday. Denies any drainage or bleeding.

## 2017-03-09 NOTE — Discharge Instructions (Signed)
For your pimple, apply over the counter antibiotic twice daily.  For your allergies, take an over the counter antihistamine every day and I have prescribed a medicine called singulair, take one tablet at night at bedtime. Should your symptoms persist or fail to resolve follow up with a primary care provider or return to clinic.

## 2017-03-09 NOTE — ED Provider Notes (Signed)
CSN: [id number]     Arrival date & time 03/09/17  1603 History   First MD Initiated Contact with Patient 03/09/17 1627     Chief Complaint  Patient presents with  . Sore   (Consider location/radiation/quality/duration/timing/severity/associated sxs/prior Treatment) 26 year old male presents to clinic with a chief complaint of a "pimple" on the inside of his nose. He states he "popped it" and yellow pus came out. He states that this "bump" is uncomfortable and irritating him.    He is also complaining of congestion, runny nose, congestion, and itchy eyes.    The history is provided by the patient.    Past Medical History:  Diagnosis Date  . Arthritis    lower back  . Eczema    History reviewed. No pertinent surgical history. Family History  Problem Relation Age of Onset  . Diabetes Sister   . Congestive Heart Failure Sister   . Kidney failure Sister   . Thyroid disease Sister   . Arthritis Father   . Arthritis Maternal Grandmother   . Prostate cancer Maternal Grandfather   . Arthritis Paternal Grandmother   . Stroke Mother    Social History  Substance Use Topics  . Smoking status: Never Smoker  . Smokeless tobacco: Never Used  . Alcohol use Yes     Comment: occ    Review of Systems  Constitutional: Negative for chills and fever.  HENT: Positive for congestion, rhinorrhea, sinus pressure and sneezing. Negative for sinus pain.   Eyes: Positive for itching. Negative for discharge and redness.  Respiratory: Negative for cough and shortness of breath.   Cardiovascular: Negative for chest pain and palpitations.  Gastrointestinal: Negative for nausea and vomiting.  Musculoskeletal: Negative.   Skin: Negative.   Neurological: Negative.     Allergies  Patient has no known allergies.  Home Medications   Prior to Admission medications   Medication Sig Start Date End Date Taking? Authorizing Provider  famotidine (PEPCID) 20 MG tablet Take 1 tablet (20 mg total) by  mouth 2 (two) times daily. 09/21/16   Shanker Levora Dredge, MD  montelukast (SINGULAIR) 10 MG tablet Take 1 tablet (10 mg total) by mouth at bedtime. 03/09/17   Dorena Bodo, NP   Meds Ordered and Administered this Visit  Medications - No data to display  BP (!) 158/95   Pulse 85   Temp 98.5 F (36.9 C)   Resp 18   SpO2 98%  No data found.   Physical Exam  Constitutional: He is oriented to person, place, and time. He appears well-developed and well-nourished. No distress.  HENT:  Head: Normocephalic and atraumatic.  Right Ear: External ear normal.  Left Ear: External ear normal.  Nose:    Eyes: Conjunctivae are normal. Right eye exhibits no discharge. No scleral icterus.  Neck: Normal range of motion.  Cardiovascular: Normal rate and regular rhythm.   Pulmonary/Chest: Effort normal and breath sounds normal.  Neurological: He is alert and oriented to person, place, and time.  Skin: Skin is warm and dry. Capillary refill takes less than 2 seconds. He is not diaphoretic.  Psychiatric: He has a normal mood and affect. His behavior is normal.  Nursing note and vitals reviewed.   Urgent Care Course     Procedures (including critical care time)  Labs Review Labs Reviewed - No data to display  Imaging Review No results found.       MDM   1. Seasonal allergic rhinitis, unspecified trigger   2.  [place]    For allergy like symptoms, start over-the-counter antihistamine such as Claritin or Zyrtec daily, was prescribed Singulair to take every night.  For his pustular lesion, apply bacitracin twice daily, avoid picking at it, should it persist, or cause further symptoms follow-up with primary care return to clinic     Dorena Bodo, NP 03/09/17 1656

## 2019-07-18 ENCOUNTER — Other Ambulatory Visit: Payer: Self-pay

## 2019-07-19 ENCOUNTER — Encounter: Payer: Self-pay | Admitting: Family Medicine

## 2019-07-19 ENCOUNTER — Ambulatory Visit (INDEPENDENT_AMBULATORY_CARE_PROVIDER_SITE_OTHER): Payer: 59 | Admitting: Family Medicine

## 2019-07-19 VITALS — BP 150/100 | HR 80 | Temp 98.1°F | Ht 72.0 in | Wt >= 6400 oz

## 2019-07-19 DIAGNOSIS — I10 Essential (primary) hypertension: Secondary | ICD-10-CM

## 2019-07-19 DIAGNOSIS — Z0000 Encounter for general adult medical examination without abnormal findings: Secondary | ICD-10-CM

## 2019-07-19 DIAGNOSIS — Z0001 Encounter for general adult medical examination with abnormal findings: Secondary | ICD-10-CM | POA: Diagnosis not present

## 2019-07-19 DIAGNOSIS — Z2821 Immunization not carried out because of patient refusal: Secondary | ICD-10-CM

## 2019-07-19 LAB — LIPID PANEL
Cholesterol: 237 mg/dL — ABNORMAL HIGH (ref 0–200)
HDL: 35.6 mg/dL — ABNORMAL LOW (ref >39.00)
LDL Cholesterol: 171 mg/dL — ABNORMAL HIGH (ref 0–99)
NonHDL: 201.09
Total CHOL/HDL Ratio: 7
Triglycerides: 150 mg/dL — ABNORMAL HIGH (ref 0.0–149.0)
VLDL: 30 mg/dL (ref 0.0–40.0)

## 2019-07-19 LAB — BASIC METABOLIC PANEL
BUN: 12 mg/dL (ref 6–23)
CO2: 28 mEq/L (ref 19–32)
Calcium: 9.5 mg/dL (ref 8.4–10.5)
Chloride: 99 mEq/L (ref 96–112)
Creatinine, Ser: 0.91 mg/dL (ref 0.40–1.50)
GFR: 118.28 mL/min (ref >60.00)
Glucose, Bld: 245 mg/dL — ABNORMAL HIGH (ref 70–99)
Potassium: 4.2 mEq/L (ref 3.5–5.1)
Sodium: 135 mEq/L (ref 135–145)

## 2019-07-19 LAB — HEMOGLOBIN A1C: Hgb A1c MFr Bld: 11.4 % — ABNORMAL HIGH (ref 4.6–6.5)

## 2019-07-19 LAB — ALT: ALT: 22 U/L (ref 0–53)

## 2019-07-19 LAB — AST: AST: 15 U/L (ref 0–37)

## 2019-07-19 MED ORDER — LOSARTAN POTASSIUM 50 MG PO TABS: 50.0000 mg | ORAL_TABLET | Freq: Every day | ORAL | 3 refills | Status: DC

## 2019-07-19 NOTE — Progress Notes (Signed)
James Quinn is a 28 y.o. male  Chief Complaint  Patient presents with  . Establish Care    est care/ CPE- not fasting/ denies flu    PNT:[id number] [name] is a 28 y.o. male here as a new patient to our office for CPE and fasting labs. He declines the flu vaccine today. He notes BP elevated at dentist 1 week ago. BP elevated today as well. Fam h/o HTN in father.   Dental: [facility] - pt has appt scheduled  Diet/Exercise: lots of fast food (usually 5 days/wk for lunch), no exercise  Med refills needed today: none  Past Medical History:  Diagnosis Date  . Arthritis    lower back  . Eczema     No past surgical history on file.  Social History   Socioeconomic History  . Marital status: Single    Spouse name: Not on file  . Number of children: 0  . Years of education: 86  . Highest education level: Not on file  Occupational History  . Occupation: Animal nutritionist  Social Needs  . Financial resource strain: Not on file  . Food insecurity    Worry: Not on file    Inability: Not on file  . Transportation needs    Medical: Not on file    Non-medical: Not on file  Tobacco Use  . Smoking status: Never Smoker  . Smokeless tobacco: Never Used  Substance and Sexual Activity  . Alcohol use: Yes    Comment: occ  . Drug use: No  . Sexual activity: Yes    Partners: Female    Birth control/protection: Condom  Lifestyle  . Physical activity    Days per week: Not on file    Minutes per session: Not on file  . Stress: Not on file  Relationships  . Social Herbalist on phone: Not on file    Gets together: Not on file    Attends religious service: Not on file    Active member of club or organization: Not on file    Attends meetings of clubs or organizations: Not on file    Relationship status: Not on file  . Intimate partner violence    Fear of current or ex partner: Not on file    Emotionally abused: Not on file    Physically abused: Not on  file    Forced sexual activity: Not on file  Other Topics Concern  . Not on file  Social History Narrative   Fun: Workout, movies, go out bowling.   Denies religious beliefs effecting healthcare.     Family History  Problem Relation Age of Onset  . Diabetes Sister   . Congestive Heart Failure Sister   . Kidney failure Sister   . Thyroid disease Sister   . Arthritis Father   . Arthritis Maternal Grandmother   . Prostate cancer Maternal Grandfather   . Arthritis Paternal Grandmother   . Stroke Mother      Immunization History  Administered Date(s) Administered  . Tdap 04/10/2015    Outpatient Encounter Medications as of 07/19/2019  Medication Sig  . montelukast (SINGULAIR) 10 MG tablet Take 1 tablet (10 mg total) by mouth at bedtime.  . [DISCONTINUED] famotidine (PEPCID) 20 MG tablet Take 1 tablet (20 mg total) by mouth 2 (two) times daily.   No facility-administered encounter medications on file as of 07/19/2019.      ROS: Gen: no fever, chills  Skin: no rash,  itching ENT: no ear pain, ear drainage, nasal congestion, rhinorrhea, sinus pressure, sore throat Eyes: no blurry vision, double vision Resp: no cough, wheeze,SOB CV: no CP, palpitations, LE edema,  GI: no heartburn, n/v/d/c, abd pain GU: no dysuria, urgency, frequency, hematuria; no testicular swelling or masses MSK: no joint pain, myalgias, back pain Neuro: no dizziness, headache, weakness, vertigo Psych: no depression, anxiety, insomnia   No Known Allergies  BP (!) 150/100   Pulse 80   Temp 98.1 F (36.7 C) (Oral)   Ht 6' (1.829 m)   Wt (!) 493 lb 6.4 oz (223.8 kg)   SpO2 96%   BMI 66.92 kg/m  BP Readings from Last 3 Encounters:  07/19/19 (!) 150/100  03/09/17 (!) 158/95  10/31/16 136/84    Physical Exam  Constitutional: He is oriented to person, place, and time. He appears well-developed and well-nourished. No distress.  Morbidly obese  HENT:  Head: Normocephalic and atraumatic.  Right  Ear: Tympanic membrane and ear canal normal.  Left Ear: Tympanic membrane and ear canal normal.  Nose: Nose normal.  Mouth/Throat: Oropharynx is clear and moist and mucous membranes are normal.  Neck: Neck supple. No thyromegaly present.  Cardiovascular: Normal rate, regular rhythm, normal heart sounds and intact distal pulses.  Pulmonary/Chest: Effort normal and breath sounds normal. He has no wheezes. He has no rhonchi.  Abdominal: Soft. Bowel sounds are normal. He exhibits no distension and no mass. There is no abdominal tenderness. There is no rebound and no guarding.  Musculoskeletal: Normal range of motion.        General: No edema.  Lymphadenopathy:    He has no cervical adenopathy.  Neurological: He is alert and oriented to person, place, and time. He exhibits normal muscle tone. Coordination normal.  Skin: Skin is warm and dry.  Psychiatric: He has a normal mood and affect.     A/P:  1. Annual physical exam - declines flu vaccine, otherwise vaccines UTD - dental exam UTD, vision exam scheduled - discussed importance of regular CV exercise, healthy diet, adequate sleep - ALT - AST - Basic metabolic panel - Lipid panel - Hemoglobin A1c - HIV Antibody (routine testing w rflx) - next CPE in 1 year  2. Morbid obesity (HCC) - pt eats fast food 4+ times per week, no exercise - discussed vital importance of weight loss, dietary changes, regular CV exercise - Lipid panel - Hemoglobin A1c - at f/u appt in 2-3 wks for HTN, will discuss possible referral to bariatric surgery  3. Influenza vaccination declined by patient  4. Essential hypertension - BP elevated/above goal - discussed vital importance of weight loss, dietary changes, regular CV exercise. DASH diet info included in AVS Rx: - losartan (COZAAR) 50 MG tablet; Take 1 tablet (50 mg total) by mouth daily.  Dispense: 90 tablet; Refill: 3 - f/u in 2-3 wks or sooner PRN Discussed plan and reviewed medications with  patient, including risks, benefits, and potential side effects. Pt expressed understand. All questions answered.

## 2019-07-19 NOTE — Patient Instructions (Addendum)
Health Maintenance, Male Adopting a healthy lifestyle and getting preventive care are important in promoting health and wellness. Ask your health care provider about:  The right schedule for you to have regular tests and exams.  Things you can do on your own to prevent diseases and keep yourself healthy. What should I know about diet, weight, and exercise? Eat a healthy diet   Eat a diet that includes plenty of vegetables, fruits, low-fat dairy products, and lean protein.  Do not eat a lot of foods that are high in solid fats, added sugars, or sodium. Maintain a healthy weight Body mass index (BMI) is a measurement that can be used to identify possible weight problems. It estimates body fat based on height and weight. Your health care provider can help determine your BMI and help you achieve or maintain a healthy weight. Get regular exercise Get regular exercise. This is one of the most important things you can do for your health. Most adults should:  Exercise for at least 150 minutes each week. The exercise should increase your heart rate and make you sweat (moderate-intensity exercise).  Do strengthening exercises at least twice a week. This is in addition to the moderate-intensity exercise.  Spend less time sitting. Even light physical activity can be beneficial. Watch cholesterol and blood lipids Have your blood tested for lipids and cholesterol at 30 years of age, then have this test every 5 years. You may need to have your cholesterol levels checked more often if:  Your lipid or cholesterol levels are high.  You are older than 30 years of age.  You are at high risk for heart disease. What should I know about cancer screening? Many types of cancers can be detected early and may often be prevented. Depending on your health history and family history, you may need to have cancer screening at various ages. This may include screening for:  Colorectal cancer.  Prostate  cancer.  Skin cancer.  Lung cancer. What should I know about heart disease, diabetes, and high blood pressure? Blood pressure and heart disease  High blood pressure causes heart disease and increases the risk of stroke. This is more likely to develop in people who have high blood pressure readings, are of African descent, or are overweight.  Talk with your health care provider about your target blood pressure readings.  Have your blood pressure checked: ? Every 3-5 years if you are 18-39 years of age. ? Every year if you are 40 years old or older.  If you are between the ages of 65 and 75 and are a current or former smoker, ask your health care provider if you should have a one-time screening for abdominal aortic aneurysm (AAA). Diabetes Have regular diabetes screenings. This checks your fasting blood sugar level. Have the screening done:  Once every three years after age 45 if you are at a normal weight and have a low risk for diabetes.  More often and at a younger age if you are overweight or have a high risk for diabetes. What should I know about preventing infection? Hepatitis B If you have a higher risk for hepatitis B, you should be screened for this virus. Talk with your health care provider to find out if you are at risk for hepatitis B infection. Hepatitis C Blood testing is recommended for:  Everyone born from 1945 through 1965.  Anyone with known risk factors for hepatitis C. Sexually transmitted infections (STIs)  You should be screened each year   for STIs, including gonorrhea and chlamydia, if: ? You are sexually active and are younger than 30 years of age. ? You are older than 30 years of age and your health care provider tells you that you are at risk for this type of infection. ? Your sexual activity has changed since you were last screened, and you are at increased risk for chlamydia or gonorrhea. Ask your health care provider if you are at risk.  Ask your  health care provider about whether you are at high risk for HIV. Your health care provider may recommend a prescription medicine to help prevent HIV infection. If you choose to take medicine to prevent HIV, you should first get tested for HIV. You should then be tested every 3 months for as long as you are taking the medicine. Follow these instructions at home: Lifestyle  Do not use any products that contain nicotine or tobacco, such as cigarettes, e-cigarettes, and chewing tobacco. If you need help quitting, ask your health care provider.  Do not use street drugs.  Do not share needles.  Ask your health care provider for help if you need support or information about quitting drugs. Alcohol use  Do not drink alcohol if your health care provider tells you not to drink.  If you drink alcohol: ? Limit how much you have to 0-2 drinks a day. ? Be aware of how much alcohol is in your drink. In the [place] one drink equals one 12 oz bottle of beer (355 mL), one 5 oz glass of wine (148 mL), or one 1 oz glass of hard liquor (44 mL). General instructions  Schedule regular health, dental, and eye exams.  Stay current with your vaccines.  Tell your health care provider if: ? You often feel depressed. ? You have ever been abused or do not feel safe at home. Summary  Adopting a healthy lifestyle and getting preventive care are important in promoting health and wellness.  Follow your health care provider's instructions about healthy diet, exercising, and getting tested or screened for diseases.  Follow your health care provider's instructions on monitoring your cholesterol and blood pressure. This information is not intended to replace advice given to you by your health care provider. Make sure you discuss any questions you have with your health care provider. Document Released: 05/05/2008 Document Revised: 10/31/2018 Document Reviewed: 10/31/2018 Elsevier Patient Education  2020 [employer]  Inc[employer]  DASH Eating Plan DASH stands for "Dietary Approaches to Stop Hypertension." The DASH eating plan is a healthy eating plan that has been shown to reduce high blood pressure (hypertension). It may also reduce your risk for type 2 diabetes, heart disease, and stroke. The DASH eating plan may also help with weight loss. What are tips for following this plan?  General guidelines  Avoid eating more than 2,300 mg (milligrams) of salt (sodium) a day. If you have hypertension, you may need to reduce your sodium intake to 1,500 mg a day.  Limit alcohol intake to no more than 1 drink a day for nonpregnant women and 2 drinks a day for men. One drink equals 12 oz of beer, 5 oz of wine, or 1 oz of hard liquor.  Work with your health care provider to maintain a healthy body weight or to lose weight. Ask what an ideal weight is for you.  Get at least 30 minutes of exercise that causes your heart to beat faster (aerobic exercise) most days of the week. Activities may include walking, swimming,   or biking.  Work with your health care provider or diet and nutrition specialist (dietitian) to adjust your eating plan to your individual calorie needs. Reading food labels   Check food labels for the amount of sodium per serving. Choose foods with less than 5 percent of the Daily Value of sodium. Generally, foods with less than 300 mg of sodium per serving fit into this eating plan.  To find whole grains, look for the word "whole" as the first word in the ingredient list. Shopping  Buy products labeled as "low-sodium" or "no salt added."  Buy fresh foods. Avoid canned foods and premade or frozen meals. Cooking  Avoid adding salt when cooking. Use salt-free seasonings or herbs instead of table salt or sea salt. Check with your health care provider or pharmacist before using salt substitutes.  Do not fry foods. Cook foods using healthy methods such as baking, boiling, grilling, and broiling  instead.  Cook with heart-healthy oils, such as olive, canola, soybean, or sunflower oil. Meal planning  Eat a balanced diet that includes: ? 5 or more servings of fruits and vegetables each day. At each meal, try to fill half of your plate with fruits and vegetables. ? Up to 6-8 servings of whole grains each day. ? Less than 6 oz of lean meat, poultry, or fish each day. A 3-oz serving of meat is about the same size as a deck of cards. One egg equals 1 oz. ? 2 servings of low-fat dairy each day. ? A serving of nuts, seeds, or beans 5 times each week. ? Heart-healthy fats. Healthy fats called Omega-3 fatty acids are found in foods such as flaxseeds and coldwater fish, like sardines, salmon, and mackerel.  Limit how much you eat of the following: ? Canned or prepackaged foods. ? Food that is high in trans fat, such as fried foods. ? Food that is high in saturated fat, such as fatty meat. ? Sweets, desserts, sugary drinks, and other foods with added sugar. ? Full-fat dairy products.  Do not salt foods before eating.  Try to eat at least 2 vegetarian meals each week.  Eat more home-cooked food and less restaurant, buffet, and fast food.  When eating at a restaurant, ask that your food be prepared with less salt or no salt, if possible. What foods are recommended? The items listed may not be a complete list. Talk with your dietitian about what dietary choices are best for you. Grains Whole-grain or whole-wheat bread. Whole-grain or whole-wheat pasta. Brown rice. Oatmeal. Quinoa. Bulgur. Whole-grain and low-sodium cereals. Pita bread. Low-fat, low-sodium crackers. Whole-wheat flour tortillas. Vegetables Fresh or frozen vegetables (raw, steamed, roasted, or grilled). Low-sodium or reduced-sodium tomato and vegetable juice. Low-sodium or reduced-sodium tomato sauce and tomato paste. Low-sodium or reduced-sodium canned vegetables. Fruits All fresh, dried, or frozen fruit. Canned fruit in  natural juice (without added sugar). Meat and other protein foods Skinless chicken or turkey. Ground chicken or turkey. Pork with fat trimmed off. Fish and seafood. Egg whites. Dried beans, peas, or lentils. Unsalted nuts, nut butters, and seeds. Unsalted canned beans. Lean cuts of beef with fat trimmed off. Low-sodium, lean deli meat. Dairy Low-fat (1%) or fat-free (skim) milk. Fat-free, low-fat, or reduced-fat cheeses. Nonfat, low-sodium ricotta or cottage cheese. Low-fat or nonfat yogurt. Low-fat, low-sodium cheese. Fats and oils Soft margarine without trans fats. Vegetable oil. Low-fat, reduced-fat, or light mayonnaise and salad dressings (reduced-sodium). Canola, safflower, olive, soybean, and sunflower oils. Avocado. Seasoning and other foods Herbs.   Spices. Seasoning mixes without salt. Unsalted popcorn and pretzels. Fat-free sweets. What foods are not recommended? The items listed may not be a complete list. Talk with your dietitian about what dietary choices are best for you. Grains Baked goods made with fat, such as croissants, muffins, or some breads. Dry pasta or rice meal packs. Vegetables Creamed or fried vegetables. Vegetables in a cheese sauce. Regular canned vegetables (not low-sodium or reduced-sodium). Regular canned tomato sauce and paste (not low-sodium or reduced-sodium). Regular tomato and vegetable juice (not low-sodium or reduced-sodium). Pickles. Olives. Fruits Canned fruit in a light or heavy syrup. Fried fruit. Fruit in cream or butter sauce. Meat and other protein foods Fatty cuts of meat. Ribs. Fried meat. Bacon. Sausage. Bologna and other processed lunch meats. Salami. Fatback. Hotdogs. Bratwurst. Salted nuts and seeds. Canned beans with added salt. Canned or smoked fish. Whole eggs or egg yolks. Chicken or turkey with skin. Dairy Whole or 2% milk, cream, and half-and-half. Whole or full-fat cream cheese. Whole-fat or sweetened yogurt. Full-fat cheese. Nondairy  creamers. Whipped toppings. Processed cheese and cheese spreads. Fats and oils Butter. Stick margarine. Lard. Shortening. Ghee. Bacon fat. Tropical oils, such as coconut, palm kernel, or palm oil. Seasoning and other foods Salted popcorn and pretzels. Onion salt, garlic salt, seasoned salt, table salt, and sea salt. Worcestershire sauce. Tartar sauce. Barbecue sauce. Teriyaki sauce. Soy sauce, including reduced-sodium. Steak sauce. Canned and packaged gravies. Fish sauce. Oyster sauce. Cocktail sauce. Horseradish that you find on the shelf. Ketchup. Mustard. Meat flavorings and tenderizers. Bouillon cubes. Hot sauce and Tabasco sauce. Premade or packaged marinades. Premade or packaged taco seasonings. Relishes. Regular salad dressings. Where to find more information:  National Heart, Lung, and Blood Institute: www.nhlbi.nih.gov  American Heart Association: www.heart.org Summary  The DASH eating plan is a healthy eating plan that has been shown to reduce high blood pressure (hypertension). It may also reduce your risk for type 2 diabetes, heart disease, and stroke.  With the DASH eating plan, you should limit salt (sodium) intake to 2,300 mg a day. If you have hypertension, you may need to reduce your sodium intake to 1,500 mg a day.  When on the DASH eating plan, aim to eat more fresh fruits and vegetables, whole grains, lean proteins, low-fat dairy, and heart-healthy fats.  Work with your health care provider or diet and nutrition specialist (dietitian) to adjust your eating plan to your individual calorie needs. This information is not intended to replace advice given to you by your health care provider. Make sure you discuss any questions you have with your health care provider. Document Released: 10/27/2011 Document Revised: 10/20/2017 Document Reviewed: 10/31/2016 Elsevier Patient Education  2020 Elsevier Inc.  

## 2019-07-20 LAB — HIV ANTIBODY (ROUTINE TESTING W REFLEX): HIV 1&2 Ab, 4th Generation: NONREACTIVE

## 2019-07-23 ENCOUNTER — Telehealth (INDEPENDENT_AMBULATORY_CARE_PROVIDER_SITE_OTHER): Payer: 59 | Admitting: Family Medicine

## 2019-07-23 ENCOUNTER — Other Ambulatory Visit: Payer: Self-pay

## 2019-07-23 ENCOUNTER — Encounter: Payer: Self-pay | Admitting: Family Medicine

## 2019-07-23 VITALS — Temp 97.0°F

## 2019-07-23 DIAGNOSIS — I10 Essential (primary) hypertension: Secondary | ICD-10-CM

## 2019-07-23 DIAGNOSIS — E119 Type 2 diabetes mellitus without complications: Secondary | ICD-10-CM

## 2019-07-23 DIAGNOSIS — E7800 Pure hypercholesterolemia, unspecified: Secondary | ICD-10-CM

## 2019-07-23 MED ORDER — FREESTYLE LANCETS MISC: 12 refills | Status: DC

## 2019-07-23 MED ORDER — FREESTYLE TEST VI STRP: ORAL_STRIP | 12 refills | Status: DC

## 2019-07-23 MED ORDER — ATORVASTATIN CALCIUM 20 MG PO TABS: 20.0000 mg | ORAL_TABLET | Freq: Every day | ORAL | 3 refills | Status: DC

## 2019-07-23 MED ORDER — FREESTYLE SYSTEM KIT: 1.0000 | PACK | 1 refills | Status: AC | PRN

## 2019-07-23 MED ORDER — ASPIRIN EC 81 MG PO TBEC: 81.0000 mg | DELAYED_RELEASE_TABLET | Freq: Every day | ORAL | 3 refills | Status: AC

## 2019-07-23 MED ORDER — METFORMIN HCL ER 750 MG PO TB24: 750.0000 mg | ORAL_TABLET | Freq: Every day | ORAL | 3 refills | Status: DC

## 2019-07-23 NOTE — Patient Instructions (Addendum)
Continue losartan 50mg  daily Start aspirin 81mg  daily Start metformin XR 500mg  daily Start atorvastatin 20mg  daily Check blood sugar 2x/day - first thing in the AM when fasting, 2hrs or so after lunch or dinner. Keep a log of these readings to bring to your next appt Referral placed for nutrition and diabetes counseling Follow-up in 2 mo or sooner if needed

## 2019-07-23 NOTE — Progress Notes (Signed)
Virtual Visit via Video Note  I connected with James Quinn on 07/23/19 at  9:00 AM EDT by a video enabled telemedicine application and verified that I am speaking with the correct person using two identifiers. Location patient: home Location provider:  home office Persons participating in the virtual visit: patient, provider  I discussed the limitations of evaluation and management by telemedicine and the availability of in person appointments. The patient expressed understanding and agreed to proceed.  Chief Complaint  Patient presents with  . Follow-up    Pt agrees to virtual visit. He is following up with labs.    HPI: James Quinn is a 28 y.o. male seen today to discuss recent lab results, in particular A1C = 11.4. He was recently started on losartan 50m daily for HTN. His lipid panel showed elevated total chol, LDL.  Pt is morbidly obese with BMI 66.92.  He has a fam h/o DM - sister.  Lab Results  Component Value Date   HGBA1C 11.4 Repeated and verified X2. (H) 07/19/2019   Lab Results  Component Value Date   CHOL 237 (H) 07/19/2019   HDL 35.60 (L) 07/19/2019   LDLCALC 171 (H) 07/19/2019   TRIG 150.0 (H) 07/19/2019   CHOLHDL 7 07/19/2019    Past Medical History:  Diagnosis Date  . Arthritis    lower back  . Eczema     No past surgical history on file.  Family History  Problem Relation Age of Onset  . Diabetes Sister   . Congestive Heart Failure Sister   . Kidney failure Sister   . Thyroid disease Sister   . Arthritis Father   . Hypertension Father   . Arthritis Maternal Grandmother   . Prostate cancer Maternal Grandfather   . Cancer Maternal Grandfather        colon cancer  . Arthritis Paternal Grandmother   . Stroke Mother     Social History   Tobacco Use  . Smoking status: Never Smoker  . Smokeless tobacco: Never Used  Substance Use Topics  . Alcohol use: Yes    Comment: occ  . Drug use: No     Current Outpatient  Medications:  .  losartan (COZAAR) 50 MG tablet, Take 1 tablet (50 mg total) by mouth daily., Disp: 90 tablet, Rfl: 3  No Known Allergies    ROS: See pertinent positives and negatives per HPI.   EXAM:  VITALS per patient if applicable: BP Readings from Last 3 Encounters:  07/19/19 (!) 150/100  03/09/17 (!) 158/95  10/31/16 136/84     GENERAL: alert, oriented, appears well and in no acute distress  NECK: normal movements of the head and neck  LUNGS: on inspection no signs of respiratory distress, breathing rate appears normal, no obvious gross SOB, gasping or wheezing, no conversational dyspnea  CV: no obvious cyanosis  MS: moves all visible extremities without noticeable abnormality  PSYCH/NEURO: pleasant and cooperative, no obvious depression or anxiety, speech and thought processing grossly intact   ASSESSMENT AND PLAN:  1. Newly diagnosed diabetes ([place] - A1C = 11.4 - cont on losartan 592mdaily Rx: - metFORMIN (GLUCOPHAGE XR) 750 MG 24 hr tablet; Take 1 tablet (750 mg total) by mouth daily with breakfast.  Dispense: 90 tablet; Refill: 3 - atorvastatin (LIPITOR) 20 MG tablet; Take 1 tablet (20 mg total) by mouth daily.  Dispense: 90 tablet; Refill: 3 - aspirin EC 81 MG tablet; Take 1 tablet (81 mg total) by mouth daily.  Dispense: 90 tablet; Refill: 3 - glucose monitoring kit (FREESTYLE) monitoring kit; 1 each by Does not apply route as needed for other. Use with test strips and lancets to check BS BID  Dispense: 1 each; Refill: 1 - pt to check BS BID, fasting in AM and 2hr post-prandial and keep log  - Lancets (FREESTYLE) lancets; Use as instructed  Dispense: 100 each; Refill: 12 - glucose blood (FREESTYLE TEST STRIPS) test strip; Use as instructed  Dispense: 100 each; Refill: 12 - Referral to Nutrition and Diabetes Services - discussed importance of regular CV exercise (walking) and weight loss - f/u in 2 mo or sooner PRN  2. Morbid obesity ([place]) - Referral to  Nutrition and Diabetes Services  3. Hypercholesterolemia Rx: - atorvastatin (LIPITOR) 20 MG tablet; Take 1 tablet (20 mg total) by mouth daily.  Dispense: 90 tablet; Refill: 3 - Referral to Nutrition and Diabetes Services  4. Essential hypertension - cont losartan 86m daily which was started less than 1 week ago - aspirin EC 81 MG tablet; Take 1 tablet (81 mg total) by mouth daily.  Dispense: 90 tablet; Refill: 3 - Referral to Nutrition and Diabetes Services  I discussed the assessment and treatment plan with the patient. The patient was provided an opportunity to ask questions and all were answered. The patient agreed with the plan and demonstrated an understanding of the instructions.   The patient was advised to call back or seek an in-person evaluation if the symptoms worsen or if the condition fails to improve as anticipated.   MLetta Median DO

## 2020-01-29 ENCOUNTER — Encounter: Payer: Self-pay | Admitting: Family Medicine

## 2020-05-12 ENCOUNTER — Encounter (HOSPITAL_COMMUNITY): Payer: Self-pay | Admitting: Emergency Medicine

## 2020-05-12 ENCOUNTER — Ambulatory Visit (HOSPITAL_COMMUNITY): Payer: Self-pay

## 2020-05-12 ENCOUNTER — Emergency Department (HOSPITAL_COMMUNITY): Payer: Commercial Managed Care - PPO

## 2020-05-12 ENCOUNTER — Emergency Department (HOSPITAL_COMMUNITY)
Admission: EM | Admit: 2020-05-12 | Discharge: 2020-05-13 | Disposition: A | Discharge status: Home / Self Care | Payer: Commercial Managed Care - PPO | Attending: Emergency Medicine | Admitting: Emergency Medicine

## 2020-05-12 ENCOUNTER — Other Ambulatory Visit: Payer: Self-pay

## 2020-05-12 ENCOUNTER — Encounter: Payer: Self-pay | Admitting: Family Medicine

## 2020-05-12 DIAGNOSIS — R739 Hyperglycemia, unspecified: Secondary | ICD-10-CM | POA: Insufficient documentation

## 2020-05-12 DIAGNOSIS — Z5321 Procedure and treatment not carried out due to patient leaving prior to being seen by health care provider: Secondary | ICD-10-CM | POA: Insufficient documentation

## 2020-05-12 DIAGNOSIS — R509 Fever, unspecified: Secondary | ICD-10-CM | POA: Insufficient documentation

## 2020-05-12 DIAGNOSIS — Z20822 Contact with and (suspected) exposure to covid-19: Secondary | ICD-10-CM | POA: Insufficient documentation

## 2020-05-12 DIAGNOSIS — R519 Headache, unspecified: Secondary | ICD-10-CM | POA: Diagnosis present

## 2020-05-12 LAB — COMPREHENSIVE METABOLIC PANEL
ALT: 29 U/L (ref 0–44)
AST: 22 U/L (ref 15–41)
Albumin: 4.2 g/dL (ref 3.5–5.0)
Alkaline Phosphatase: 79 U/L (ref 38–126)
Anion gap: 14 (ref 5–15)
BUN: 8 mg/dL (ref 6–20)
CO2: 26 mmol/L (ref 22–32)
Calcium: 9.3 mg/dL (ref 8.9–10.3)
Chloride: 93 mmol/L — ABNORMAL LOW (ref 98–111)
Creatinine, Ser: 1.15 mg/dL (ref 0.61–1.24)
GFR calc Af Amer: >60 mL/min (ref >60)
GFR calc non Af Amer: >60 mL/min (ref >60)
Glucose, Bld: 277 mg/dL — ABNORMAL HIGH (ref 70–99)
Potassium: 4 mmol/L (ref 3.5–5.1)
Sodium: 133 mmol/L — ABNORMAL LOW (ref 135–145)
Total Bilirubin: 1.1 mg/dL (ref 0.3–1.2)
Total Protein: 8.6 g/dL — ABNORMAL HIGH (ref 6.5–8.1)

## 2020-05-12 LAB — LACTIC ACID, PLASMA: Lactic Acid, Venous: 1.6 mmol/L (ref 0.5–1.9)

## 2020-05-12 LAB — CBC WITH DIFFERENTIAL/PLATELET
Abs Immature Granulocytes: 0.1 10*3/uL — ABNORMAL HIGH (ref 0.00–0.07)
Basophils Absolute: 0 10*3/uL (ref 0.0–0.1)
Basophils Relative: 0 %
Eosinophils Absolute: 0 10*3/uL (ref 0.0–0.5)
Eosinophils Relative: 0 %
HCT: 47.6 % (ref 39.0–52.0)
Hemoglobin: 15.6 g/dL (ref 13.0–17.0)
Immature Granulocytes: 1 %
Lymphocytes Relative: 8 %
Lymphs Abs: 1 10*3/uL (ref 0.7–4.0)
MCH: 29.5 pg (ref 26.0–34.0)
MCHC: 32.8 g/dL (ref 30.0–36.0)
MCV: 90.2 fL (ref 80.0–100.0)
Monocytes Absolute: 1.1 10*3/uL — ABNORMAL HIGH (ref 0.1–1.0)
Monocytes Relative: 9 %
Neutro Abs: 9.9 10*3/uL — ABNORMAL HIGH (ref 1.7–7.7)
Neutrophils Relative %: 82 %
Platelets: 194 10*3/uL (ref 150–400)
RBC: 5.28 MIL/uL (ref 4.22–5.81)
RDW: 12.5 % (ref 11.5–15.5)
WBC: 12.1 10*3/uL — ABNORMAL HIGH (ref 4.0–10.5)
nRBC: 0 % (ref 0.0–0.2)

## 2020-05-12 LAB — CBG MONITORING, ED: Glucose-Capillary: 243 mg/dL — ABNORMAL HIGH (ref 70–99)

## 2020-05-12 LAB — PROTIME-INR
INR: 1.2 (ref 0.8–1.2)
Prothrombin Time: 14.4 seconds (ref 11.4–15.2)

## 2020-05-12 MED ORDER — ACETAMINOPHEN 325 MG PO TABS
650.0000 mg | ORAL_TABLET | Freq: Once | ORAL | Status: AC | PRN
  Administered 2020-05-12: 650 mg via ORAL
  Filled 2020-05-12: qty 2

## 2020-05-12 NOTE — Telephone Encounter (Signed)
F.Y.I  I called James Quinn and informed him of Dr. Evangeline Gula opinion that James Quinn should go to ER as advised.  James Quinn will follow up with Dr. Salena Saner, as he was already on her schedule to be seen this Friday.

## 2020-05-12 NOTE — ED Triage Notes (Signed)
Patient here from urgent care reporting hyperglycemia and headache that started on Sunday. Fever, no cough, SOB or chest pain. Urgent care sent over patient work for DKA.

## 2020-05-12 NOTE — Telephone Encounter (Signed)
Patient should go to ER for eval as advised. Please go ahead and schedule a follow up for him with Dr. Barron Alvine.

## 2020-05-13 ENCOUNTER — Encounter (HOSPITAL_COMMUNITY): Payer: Self-pay | Admitting: Emergency Medicine

## 2020-05-13 ENCOUNTER — Other Ambulatory Visit: Payer: Self-pay

## 2020-05-13 ENCOUNTER — Emergency Department (HOSPITAL_COMMUNITY): Payer: Commercial Managed Care - PPO

## 2020-05-13 ENCOUNTER — Emergency Department (HOSPITAL_COMMUNITY)
Admission: EM | Admit: 2020-05-13 | Discharge: 2020-05-13 | Disposition: A | Discharge status: Home / Self Care | Payer: Commercial Managed Care - PPO | Source: Home / Self Care | Attending: Emergency Medicine | Admitting: Emergency Medicine

## 2020-05-13 DIAGNOSIS — J189 Pneumonia, unspecified organism: Secondary | ICD-10-CM

## 2020-05-13 DIAGNOSIS — R739 Hyperglycemia, unspecified: Secondary | ICD-10-CM

## 2020-05-13 DIAGNOSIS — E119 Type 2 diabetes mellitus without complications: Secondary | ICD-10-CM

## 2020-05-13 LAB — BLOOD GAS, VENOUS
Acid-Base Excess: 3.3 mmol/L — ABNORMAL HIGH (ref 0.0–2.0)
Bicarbonate: 27.2 mmol/L (ref 20.0–28.0)
FIO2: 21
O2 Saturation: 46.8 %
Patient temperature: 98.6
pCO2, Ven: 40.3 mmHg — ABNORMAL LOW (ref 44.0–60.0)
pH, Ven: 7.444 — ABNORMAL HIGH (ref 7.250–7.430)
pO2, Ven: <32 mmHg — CL (ref 32.0–45.0)

## 2020-05-13 LAB — COMPREHENSIVE METABOLIC PANEL
ALT: 35 U/L (ref 0–44)
AST: 26 U/L (ref 15–41)
Albumin: 3.7 g/dL (ref 3.5–5.0)
Alkaline Phosphatase: 77 U/L (ref 38–126)
Anion gap: 14 (ref 5–15)
BUN: 9 mg/dL (ref 6–20)
CO2: 23 mmol/L (ref 22–32)
Calcium: 8.7 mg/dL — ABNORMAL LOW (ref 8.9–10.3)
Chloride: 93 mmol/L — ABNORMAL LOW (ref 98–111)
Creatinine, Ser: 1.24 mg/dL (ref 0.61–1.24)
GFR calc Af Amer: >60 mL/min (ref >60)
GFR calc non Af Amer: >60 mL/min (ref >60)
Glucose, Bld: 253 mg/dL — ABNORMAL HIGH (ref 70–99)
Potassium: 3.7 mmol/L (ref 3.5–5.1)
Sodium: 130 mmol/L — ABNORMAL LOW (ref 135–145)
Total Bilirubin: 0.9 mg/dL (ref 0.3–1.2)
Total Protein: 8.3 g/dL — ABNORMAL HIGH (ref 6.5–8.1)

## 2020-05-13 LAB — CBC WITH DIFFERENTIAL/PLATELET
Abs Immature Granulocytes: 0.09 10*3/uL — ABNORMAL HIGH (ref 0.00–0.07)
Basophils Absolute: 0 10*3/uL (ref 0.0–0.1)
Basophils Relative: 0 %
Eosinophils Absolute: 0 10*3/uL (ref 0.0–0.5)
Eosinophils Relative: 0 %
HCT: 44.4 % (ref 39.0–52.0)
Hemoglobin: 14.3 g/dL (ref 13.0–17.0)
Immature Granulocytes: 1 %
Lymphocytes Relative: 6 %
Lymphs Abs: 0.7 10*3/uL (ref 0.7–4.0)
MCH: 28.4 pg (ref 26.0–34.0)
MCHC: 32.2 g/dL (ref 30.0–36.0)
MCV: 88.3 fL (ref 80.0–100.0)
Monocytes Absolute: 1.2 10*3/uL — ABNORMAL HIGH (ref 0.1–1.0)
Monocytes Relative: 9 %
Neutro Abs: 10.2 10*3/uL — ABNORMAL HIGH (ref 1.7–7.7)
Neutrophils Relative %: 84 %
Platelets: 167 10*3/uL (ref 150–400)
RBC: 5.03 MIL/uL (ref 4.22–5.81)
RDW: 12.8 % (ref 11.5–15.5)
WBC: 12.2 10*3/uL — ABNORMAL HIGH (ref 4.0–10.5)
nRBC: 0 % (ref 0.0–0.2)

## 2020-05-13 LAB — URINALYSIS, ROUTINE W REFLEX MICROSCOPIC
Bacteria, UA: NONE SEEN
Bilirubin Urine: NEGATIVE
Glucose, UA: >=500 mg/dL — AB
Ketones, ur: 80 mg/dL — AB
Leukocytes,Ua: NEGATIVE
Nitrite: NEGATIVE
Protein, ur: >=300 mg/dL — AB
RBC / HPF: >50 RBC/hpf — ABNORMAL HIGH (ref 0–5)
Specific Gravity, Urine: 1.037 — ABNORMAL HIGH (ref 1.005–1.030)
pH: 5 (ref 5.0–8.0)

## 2020-05-13 LAB — CBG MONITORING, ED: Glucose-Capillary: 266 mg/dL — ABNORMAL HIGH (ref 70–99)

## 2020-05-13 LAB — LACTIC ACID, PLASMA
Lactic Acid, Venous: 1.6 mmol/L (ref 0.5–1.9)
Lactic Acid, Venous: 1.4 mmol/L (ref 0.5–1.9)

## 2020-05-13 LAB — SARS CORONAVIRUS 2 BY RT PCR (HOSPITAL ORDER, PERFORMED IN ~~LOC~~ HOSPITAL LAB): SARS Coronavirus 2: NEGATIVE

## 2020-05-13 MED ORDER — METFORMIN HCL ER 750 MG PO TB24: 750.0000 mg | ORAL_TABLET | Freq: Every day | ORAL | 3 refills | Status: DC

## 2020-05-13 MED ORDER — IBUPROFEN 200 MG PO TABS
600.0000 mg | ORAL_TABLET | Freq: Once | ORAL | Status: AC
  Administered 2020-05-13: 600 mg via ORAL
  Filled 2020-05-13: qty 3

## 2020-05-13 MED ORDER — DOXYCYCLINE HYCLATE 100 MG PO CAPS
100.0000 mg | ORAL_CAPSULE | Freq: Two times a day (BID) | ORAL | 0 refills | Status: DC
Start: 2020-05-13 — End: 2020-12-24

## 2020-05-13 MED ORDER — ACETAMINOPHEN 500 MG PO TABS
1000.0000 mg | ORAL_TABLET | Freq: Once | ORAL | Status: AC
  Administered 2020-05-13: 1000 mg via ORAL
  Filled 2020-05-13: qty 2

## 2020-05-13 MED ORDER — SODIUM CHLORIDE 0.9 % IV SOLN
1.0000 g | Freq: Once | INTRAVENOUS | Status: AC
  Administered 2020-05-13: 1 g via INTRAVENOUS
  Filled 2020-05-13: qty 10

## 2020-05-13 MED ORDER — SODIUM CHLORIDE 0.9 % IV BOLUS
1000.0000 mL | Freq: Once | INTRAVENOUS | Status: AC
  Administered 2020-05-13: 1000 mL via INTRAVENOUS

## 2020-05-13 NOTE — ED Provider Notes (Signed)
[facility] DEPT Provider Note   CSN: [id number] Arrival date & time: 05/13/20  1022     History Chief Complaint  Patient presents with  . Fever  . Hyperglycemia    James Quinn is a 29 y.o. male past medical history of diabetes who presents for evaluation of fever, fatigue.  He reports that over the last 4 days, he has noted fever up to 102 at home.  He states that he has felt tired and fatigued.  He went to urgent care yesterday and was advised to go to the emergency department for evaluation of possible DKA.  He does have a history of diabetes and states he has been off of his meds for 2 months.  He came yesterday and got a chest x-ray but left prior to being seen.  He comes back today because he states he continues to run fever and continues to feel bad.  He states he feels like he has not had any energy.  He has had some lower back pain but otherwise denies any complaints.  He states that he last took Tylenol yesterday.  Has not taken any Tylenol or ibuprofen today.  He reports that he did not get Covid vaccinated.  He does not have any known COVID-19 exposure.  He denies any chest pain, difficulty breathing, cough, abdominal pain, nausea/vomiting, dysuria, hematuria.  The history is provided by the patient.       Past Medical History:  Diagnosis Date  . Arthritis    lower back  . Eczema     Patient Active Problem List   Diagnosis Date Noted  . Urticaria 09/18/2016  . Sepsis, unspecified organism ([place]) 09/18/2016  . Sepsis ([place]) 09/18/2016  . Morbid obesity ([place]) 04/10/2015    History reviewed. No pertinent surgical history.     Family History  Problem Relation Age of Onset  . Diabetes Sister   . Congestive Heart Failure Sister   . Kidney failure Sister   . Thyroid disease Sister   . Arthritis Father   . Hypertension Father   . Arthritis Maternal Grandmother   . Prostate cancer Maternal Grandfather   . Cancer Maternal  Grandfather        colon cancer  . Arthritis Paternal Grandmother   . Stroke Mother     Social History   Tobacco Use  . Smoking status: Never Smoker  . Smokeless tobacco: Never Used  Substance Use Topics  . Alcohol use: Yes    Comment: occ  . Drug use: No    Home Medications Prior to Admission medications   Medication Sig Start Date End Date Taking? Authorizing Provider  Cayenne 450 MG CAPS Take 450 mg by mouth daily.   Yes [provider]  Cinnamon 500 MG capsule Take 500 mg by mouth daily.   Yes [provider]  Misc Natural Products (AIRBORNE ELDERBERRY PO) Take 1 tablet by mouth daily.   Yes [provider]  aspirin EC 81 MG tablet Take 1 tablet (81 mg total) by mouth daily. Patient not taking: Reported on 05/13/2020 07/23/19   Ronnald Nian, DO  atorvastatin (LIPITOR) 20 MG tablet Take 1 tablet (20 mg total) by mouth daily. Patient not taking: Reported on 05/13/2020 07/23/19   Ronnald Nian, DO  doxycycline (VIBRAMYCIN) 100 MG capsule Take 1 capsule (100 mg total) by mouth 2 (two) times daily. 05/13/20   Blanchie Dessert, MD  glucose blood (FREESTYLE TEST STRIPS) test strip Use as instructed  07/23/19   Cirigliano, Garvin Fila, DO  glucose monitoring kit (FREESTYLE) monitoring kit 1 each by Does not apply route as needed for other. Use with test strips and lancets to check BS BID 07/23/19   Cirigliano, Garvin Fila, DO  Lancets (FREESTYLE) lancets Use as instructed 07/23/19   Cirigliano, Garvin Fila, DO  losartan (COZAAR) 50 MG tablet Take 1 tablet (50 mg total) by mouth daily. Patient not taking: Reported on 05/13/2020 07/19/19   Ronnald Nian, DO  metFORMIN (GLUCOPHAGE XR) 750 MG 24 hr tablet Take 1 tablet (750 mg total) by mouth daily with breakfast. 05/13/20   Blanchie Dessert, MD    Allergies    Patient has no known allergies.  Review of Systems   Review of Systems  Constitutional: Positive for activity change, fatigue and fever.  Respiratory: Negative  for cough and shortness of breath.   Cardiovascular: Negative for chest pain.  Gastrointestinal: Negative for abdominal pain, nausea and vomiting.  Genitourinary: Negative for dysuria and hematuria.  Neurological: Negative for headaches.  All other systems reviewed and are negative.   Physical Exam Updated Vital Signs BP (!) 158/101   Pulse 97   Temp (!) 101.3 F (38.5 C) (Oral)   Resp 20   Ht 6' (1.829 m)   Wt (!) 223 kg   SpO2 96%   BMI 66.68 kg/m   Physical Exam Vitals and nursing note reviewed.  Constitutional:      Appearance: Normal appearance. He is well-developed.  HENT:     Head: Normocephalic and atraumatic.  Eyes:     General: Lids are normal.     Conjunctiva/sclera: Conjunctivae normal.     Pupils: Pupils are equal, round, and reactive to light.  Cardiovascular:     Rate and Rhythm: Regular rhythm. Tachycardia present.     Pulses: Normal pulses.     Heart sounds: Normal heart sounds. No murmur heard.  No friction rub. No gallop.   Pulmonary:     Effort: Pulmonary effort is normal.     Breath sounds: Normal breath sounds.     Comments: Exam limited by body habitus. No obvious wheezing. No evidence of respiratory distress.  Abdominal:     Palpations: Abdomen is soft. Abdomen is not rigid.     Tenderness: There is no abdominal tenderness. There is no guarding.     Comments: Abdomen is soft, non-distended, non-tender. No rigidity, No guarding. No peritoneal signs  Musculoskeletal:        General: Normal range of motion.     Cervical back: Full passive range of motion without pain.  Skin:    General: Skin is warm and dry.     Capillary Refill: Capillary refill takes less than 2 seconds.  Neurological:     Mental Status: He is alert and oriented to person, place, and time.  Psychiatric:        Speech: Speech normal.     ED Results / Procedures / Treatments   Labs (all labs ordered are listed, but only abnormal results are displayed) Labs Reviewed    COMPREHENSIVE METABOLIC PANEL - Abnormal; Notable for the following components:      Result Value   Sodium 130 (*)    Chloride 93 (*)    Glucose, Bld 253 (*)    Calcium 8.7 (*)    Total Protein 8.3 (*)    All other components within normal limits  CBC WITH DIFFERENTIAL/PLATELET - Abnormal; Notable for the following components:   WBC 12.2 (*)  Neutro Abs 10.2 (*)    Monocytes Absolute 1.2 (*)    Abs Immature Granulocytes 0.09 (*)    All other components within normal limits  BLOOD GAS, VENOUS - Abnormal; Notable for the following components:   pH, Ven 7.444 (*)    pCO2, Ven 40.3 (*)    pO2, Ven <32.0 (*)    Acid-Base Excess 3.3 (*)    All other components within normal limits  URINALYSIS, ROUTINE W REFLEX MICROSCOPIC - Abnormal; Notable for the following components:   Color, Urine AMBER (*)    Specific Gravity, Urine 1.037 (*)    Glucose, UA >=500 (*)    Hgb urine dipstick MODERATE (*)    Ketones, ur 80 (*)    Protein, ur >=300 (*)    RBC / HPF >50 (*)    All other components within normal limits  CBG MONITORING, ED - Abnormal; Notable for the following components:   Glucose-Capillary 266 (*)    All other components within normal limits  SARS CORONAVIRUS 2 BY RT PCR (HOSPITAL ORDER, [facility])  CULTURE, BLOOD (ROUTINE X 2)  CULTURE, BLOOD (ROUTINE X 2)  LACTIC ACID, PLASMA  LACTIC ACID, PLASMA    EKG None  Radiology DG Chest 2 View  Result Date: 05/12/2020 CLINICAL DATA:  Hyperglycemia and headache fever EXAM: CHEST - 2 VIEW COMPARISON:  09/18/2016 FINDINGS: Right upper lobe opacity with suggestion of mild air bronchograms. No pleural effusion. Normal heart size. No pneumothorax. IMPRESSION: Right upper lobe opacity, suspicious for pneumonia. Electronically Signed   By: Donavan Foil M.D.   On: 05/12/2020 18:39   DG Chest Portable 1 View  Result Date: 05/13/2020 CLINICAL DATA:  Fever EXAM: PORTABLE CHEST 1 VIEW COMPARISON:  05/12/2020  FINDINGS: Right upper lobe consolidative opacities again noted. Stable mild elevation of the right hemidiaphragm. No significant pleural effusion. Stable cardiomediastinal contours. IMPRESSION: Persistent right upper lobe consolidation likely reflecting pneumonia. Electronically Signed   By: Macy Mis M.D.   On: 05/13/2020 11:54    Procedures Procedures (including critical care time)  Medications Ordered in ED Medications  acetaminophen (TYLENOL) tablet 1,000 mg (1,000 mg Oral Given 05/13/20 1140)  sodium chloride 0.9 % bolus 1,000 mL (0 mLs Intravenous Stopped 05/13/20 1545)  sodium chloride 0.9 % bolus 1,000 mL (0 mLs Intravenous Stopped 05/13/20 1545)  ibuprofen (ADVIL) tablet 600 mg (600 mg Oral Given 05/13/20 1257)  cefTRIAXone (ROCEPHIN) 1 g in sodium chloride 0.9 % 100 mL IVPB (0 g Intravenous Stopped 05/13/20 1328)    ED Course  I have reviewed the triage vital signs and the nursing notes.  Pertinent labs & imaging results that were available during my care of the patient were reviewed by me and considered in my medical decision making (see chart for details).    MDM Rules/Calculators/A&P                          29 year old male past with history of diabetes who presents for evaluation of fever, fatigue x4 days.  Initially went to urgent care yesterday and was sent to the ED for further evaluation.  He came but left prior to being seen.  He comes back today because he continues to have symptoms.  On initial ED arrival, he is febrile at 100.7, tachycardic.  No evidence of respiratory distress.  He has a benign abdominal exam.  He did have a chest x-ray that was done yesterday that showed  findings concerning for pneumonia in the right upper lobe.  We will plan to check labs, repeat chest x-ray to ensure he is not in DKA.  POC CBG is 266.  CBC shows slight leukocytosis of 12.2.  CMP shows glucose of 253.  BUN/creatinine within normal limits.  Bicarb is 23.  Anion gap is 14.  UA shows  hemoglobin.  Small amount of ketones.  VBG shows pH of 7.4.  Covid is negative.  Chest x-ray shows persistent right lobe pneumonia.  At this time, his work-up is not consistent with DKA.  He is still febrile and tachycardic.  We will plan to give additional fluids as well as start a dose of Rocephin here in the ED.  Reevaluation.  Patient's heart rate improved after fluids.  He still febrile.  We will give him ibuprofen.  At this time, he is not hypoxic.  I discussed with him regarding outpatient treatment with doxycycline.  Patient is agreement.  I also discussed with him regarding importance of restarting his diabetes medication.  Patient is in agreement.  Patient discharged per Dr. Acquanetta Chain instructions.  James Quinn was evaluated in Emergency Department on 05/13/2020 for the symptoms described in the history of present illness. He was evaluated in the context of the global COVID-19 pandemic, which necessitated consideration that the patient might be at risk for infection with the SARS-CoV-2 virus that causes COVID-19. Institutional protocols and algorithms that pertain to the evaluation of patients at risk for COVID-19 are in a state of rapid change based on information released by regulatory bodies including the CDC and federal and state organizations. These policies and algorithms were followed during the patient's care in the ED.  Portions of this note were generated with [employer]. Dictation errors may occur despite best attempts at proofreading.  Final Clinical Impression(s) / ED Diagnoses Final diagnoses:  Hyperglycemia  Community acquired pneumonia of right upper lobe of lung    Rx / DC Orders ED Discharge Orders         Ordered    metFORMIN (GLUCOPHAGE XR) 750 MG 24 hr tablet  Daily with breakfast     Discontinue  Reprint     05/13/20 1514    doxycycline (VIBRAMYCIN) 100 MG capsule  2 times daily     Discontinue  Reprint     05/13/20 1514             Volanda Napoleon, PA-C 05/13/20 1737    Blanchie Dessert, MD 05/15/20 [phone]

## 2020-05-13 NOTE — Discharge Instructions (Signed)
Start taking the antibiotic this evening for the pneumonia.  Continue to stay well-hydrated and you can take Tylenol and ibuprofen as needed for the fever.  If you start developing any shortness of breath to where you are unable to catch her breath you should return to the emergency room.  Plan on following up with your doctor on Friday as planned.

## 2020-05-13 NOTE — ED Triage Notes (Signed)
Patient here from home reporting ongoing fever and hyperglycemia. Here yesterday for same, but was "never seen due to long wait times". Tylenol ago.

## 2020-05-14 ENCOUNTER — Other Ambulatory Visit: Payer: Self-pay

## 2020-05-15 ENCOUNTER — Other Ambulatory Visit: Payer: Self-pay

## 2020-05-15 ENCOUNTER — Encounter: Payer: Self-pay | Admitting: Family Medicine

## 2020-05-15 ENCOUNTER — Ambulatory Visit (INDEPENDENT_AMBULATORY_CARE_PROVIDER_SITE_OTHER): Payer: Commercial Managed Care - PPO | Admitting: Family Medicine

## 2020-05-15 ENCOUNTER — Telehealth: Payer: Self-pay | Admitting: Family Medicine

## 2020-05-15 VITALS — BP 126/86 | HR 116 | Temp 97.9°F | Ht 72.0 in | Wt >= 6400 oz

## 2020-05-15 DIAGNOSIS — E7800 Pure hypercholesterolemia, unspecified: Secondary | ICD-10-CM | POA: Diagnosis not present

## 2020-05-15 DIAGNOSIS — E119 Type 2 diabetes mellitus without complications: Secondary | ICD-10-CM | POA: Diagnosis not present

## 2020-05-15 DIAGNOSIS — I10 Essential (primary) hypertension: Secondary | ICD-10-CM | POA: Diagnosis not present

## 2020-05-15 DIAGNOSIS — J189 Pneumonia, unspecified organism: Secondary | ICD-10-CM

## 2020-05-15 MED ORDER — METHYLPREDNISOLONE ACETATE 80 MG/ML IJ SUSP
80.0000 mg | Freq: Once | INTRAMUSCULAR | Status: AC
  Administered 2020-05-15: 80 mg via INTRAMUSCULAR

## 2020-05-15 MED ORDER — METFORMIN HCL ER 750 MG PO TB24: ORAL_TABLET | ORAL | 3 refills | Status: DC

## 2020-05-15 MED ORDER — FLOVENT HFA 110 MCG/ACT IN AERO: 2.0000 | INHALATION_SPRAY | Freq: Two times a day (BID) | RESPIRATORY_TRACT | 1 refills | Status: DC

## 2020-05-15 MED ORDER — BECLOMETHASONE DIPROPIONATE 40 MCG/ACT IN AERS: 1.0000 | INHALATION_SPRAY | Freq: Two times a day (BID) | RESPIRATORY_TRACT | 1 refills | Status: DC

## 2020-05-15 NOTE — Addendum Note (Signed)
Addended by: Varney Biles on: 05/15/2020 03:03 PM   Modules accepted: Orders

## 2020-05-15 NOTE — Telephone Encounter (Signed)
Please change Qvar prescription to Flovent due to insurance not covering Qvar.

## 2020-05-15 NOTE — Telephone Encounter (Signed)
rx sent

## 2020-05-15 NOTE — Telephone Encounter (Signed)
.  bt 

## 2020-05-15 NOTE — Progress Notes (Signed)
James Quinn is a 29 y.o. male  Chief Complaint  Patient presents with  . Follow-up    Pt went to urgent care Wednesday of last week.  Pt was sent to ER Thursday.  Pt has been dx with pneumonia    HPI: James Quinn is a 29 y.o. male who was seen on 05/13/20 in ER for fever, fatigue, elevated BS. Pt was diagnosed with RUL pneumonia and is Rx'd doxycycline 187m BID x 10 days. He was given rocephin in ER. Covid negative.  Notes, labs, imaging reports from ER reviewed by me today. Pt states he has been afebrile since ER. His does feel SOB with very minimal exertion. No SOB at rest. No CP.   He is well-overdue for DM, HTM f/u and last A1C, FLP 10 mo ago.  Pt has been off meds - losartan, lipitor, metformin, ASA - x 2 mo.   Lab Results  Component Value Date   CHOL 237 (H) 07/19/2019   HDL 35.60 (L) 07/19/2019   LDLCALC 171 (H) 07/19/2019   TRIG 150.0 (H) 07/19/2019   CHOLHDL 7 07/19/2019   Lab Results  Component Value Date   HGBA1C 11.4 Repeated and verified X2. (H) 07/19/2019     Past Medical History:  Diagnosis Date  . Arthritis    lower back  . Eczema     History reviewed. No pertinent surgical history.  Social History   Socioeconomic History  . Marital status: Single    Spouse name: Not on file  . Number of children: 0  . Years of education: 125 . Highest education level: Not on file  Occupational History  . Occupation: SAnimal nutritionist Tobacco Use  . Smoking status: Never Smoker  . Smokeless tobacco: Never Used  Substance and Sexual Activity  . Alcohol use: Yes    Comment: occ  . Drug use: No  . Sexual activity: Yes    Partners: Female    Birth control/protection: Condom  Other Topics Concern  . Not on file  Social History Narrative   Fun: Workout, movies, go out bowling.   Denies religious beliefs effecting healthcare.    Social Determinants of Health   Financial Resource Strain:   . Difficulty of Paying Living Expenses:   Food  Insecurity:   . Worried About RCharity fundraiserin the Last Year:   . RArboriculturistin the Last Year:   Transportation Needs:   . LFilm/video editor(Medical):   .Marland KitchenLack of Transportation (Non-Medical):   Physical Activity:   . Days of Exercise per Week:   . Minutes of Exercise per Session:   Stress:   . Feeling of Stress :   Social Connections:   . Frequency of Communication with Friends and Family:   . Frequency of Social Gatherings with Friends and Family:   . Attends Religious Services:   . Active Member of Clubs or Organizations:   . Attends CArchivistMeetings:   .Marland KitchenMarital Status:   Intimate Partner Violence:   . Fear of Current or Ex-Partner:   . Emotionally Abused:   .Marland KitchenPhysically Abused:   . Sexually Abused:     Family History  Problem Relation Age of Onset  . Diabetes Sister   . Congestive Heart Failure Sister   . Kidney failure Sister   . Thyroid disease Sister   . Arthritis Father   . Hypertension Father   . Arthritis Maternal Grandmother   .  Prostate cancer Maternal Grandfather   . Cancer Maternal Grandfather        colon cancer  . Arthritis Paternal Grandmother   . Stroke Mother      Immunization History  Administered Date(s) Administered  . Tdap 04/10/2015    Outpatient Encounter Medications as of 05/15/2020  Medication Sig  . doxycycline (VIBRAMYCIN) 100 MG capsule Take 1 capsule (100 mg total) by mouth 2 (two) times daily.  Marland Kitchen glucose blood (FREESTYLE TEST STRIPS) test strip Use as instructed  . glucose monitoring kit (FREESTYLE) monitoring kit 1 each by Does not apply route as needed for other. Use with test strips and lancets to check BS BID  . Lancets (FREESTYLE) lancets Use as instructed  . aspirin EC 81 MG tablet Take 1 tablet (81 mg total) by mouth daily. (Patient not taking: Reported on 05/13/2020)  . atorvastatin (LIPITOR) 20 MG tablet Take 1 tablet (20 mg total) by mouth daily. (Patient not taking: Reported on 05/13/2020)  .  beclomethasone (QVAR) 40 MCG/ACT inhaler Inhale 1 puff into the lungs in the morning and at bedtime.  Gretta Arab 450 MG CAPS Take 450 mg by mouth daily. (Patient not taking: Reported on 05/15/2020)  . Cinnamon 500 MG capsule Take 500 mg by mouth daily. (Patient not taking: Reported on 05/15/2020)  . losartan (COZAAR) 50 MG tablet Take 1 tablet (50 mg total) by mouth daily. (Patient not taking: Reported on 05/13/2020)  . metFORMIN (GLUCOPHAGE XR) 750 MG 24 hr tablet 1 tab po daily x 1 week then increase to 2 tabs po daily  . [DISCONTINUED] metFORMIN (GLUCOPHAGE XR) 750 MG 24 hr tablet Take 1 tablet (750 mg total) by mouth daily with breakfast. (Patient not taking: Reported on 05/15/2020)  . [DISCONTINUED] Misc Natural Products (AIRBORNE ELDERBERRY PO) Take 1 tablet by mouth daily. (Patient not taking: Reported on 05/15/2020)   Facility-Administered Encounter Medications as of 05/15/2020  Medication  . methylPREDNISolone acetate (DEPO-MEDROL) injection 80 mg     ROS: Pertinent positives and negatives noted in HPI. Remainder of ROS non-contributory    No Known Allergies  BP 126/86   Pulse (!) 116   Temp 97.9 F (36.6 C)   Ht 6' (1.829 m)   Wt (!) 463 lb 9.6 oz (210.3 kg)   SpO2 96%   BMI 62.88 kg/m   Physical Exam Constitutional:      Appearance: He is obese. He is not diaphoretic.  Cardiovascular:     Rate and Rhythm: Regular rhythm. Tachycardia present.     Pulses: Normal pulses.     Heart sounds: Normal heart sounds.  Pulmonary:     Effort: No accessory muscle usage or respiratory distress.     Breath sounds: Examination of the right-upper field reveals rhonchi. Examination of the right-middle field reveals rhonchi. Rhonchi present. No decreased breath sounds, wheezing or rales.  Musculoskeletal:     Right lower leg: No edema.     Left lower leg: No edema.  Skin:    General: Skin is warm and dry.  Neurological:     Mental Status: He is alert and oriented to person, place, and  time.  Psychiatric:        Mood and Affect: Mood normal.        Behavior: Behavior normal.      A/P:  1. Essential hypertension - controlled, at goal - not on medication x 2 mo  2. Hypercholesterolemia - no on med x 2 mo - due for FLP but pt  is not fasting today  3. Newly diagnosed diabetes ([place]) - overdue for A1C and not on med x 2 mo - Hemoglobin A1c Refill: - metFORMIN (GLUCOPHAGE XR) 750 MG 24 hr tablet; 1 tab po daily x 1 week then increase to 2 tabs po daily  Dispense: 180 tablet; Refill: 3 - will contact pt with lab result once available and likely f/u in 8 wks - resume ASA, and will need to resume statin and low dose ACE/ARB  4. Pneumonia of right upper lobe due to infectious organism - on doxycycline 159m BID x 10 days - now afebrile x 48+ hrs but SOB w/ minimal exertion Rx: - beclomethasone (QVAR) 40 MCG/ACT inhaler; Inhale 1 puff into the lungs in the morning and at bedtime.  Dispense: 1 Inhaler; Refill: 1 - methylPREDNISolone acetate (DEPO-MEDROL) injection 80 mg - complete course of abx - f/u at any time if symptoms worsen or if no/minimal improvement in 5-7 days Discussed plan and reviewed medications with patient, including risks, benefits, and potential side effects. Pt expressed understand. All questions answered.    This visit occurred during the SARS-CoV-2 public health emergency.  Safety protocols were in place, including screening questions prior to the visit, additional usage of staff PPE, and extensive cleaning of exam room while observing appropriate contact time as indicated for disinfecting solutions.

## 2020-05-15 NOTE — Addendum Note (Signed)
Addended by: Varney Biles on: 05/15/2020 02:53 PM   Modules accepted: Orders

## 2020-05-16 LAB — HEMOGLOBIN A1C
Hgb A1c MFr Bld: 10.6 % of total Hgb — ABNORMAL HIGH (ref <5.7)
Mean Plasma Glucose: 258 (calc)
eAG (mmol/L): 14.3 (calc)

## 2020-05-17 LAB — CULTURE, BLOOD (ROUTINE X 2): Culture: NO GROWTH

## 2020-05-18 LAB — CULTURE, BLOOD (ROUTINE X 2)
Culture: NO GROWTH
Culture: NO GROWTH
Special Requests: ADEQUATE
Special Requests: ADEQUATE

## 2020-06-08 ENCOUNTER — Encounter: Payer: Self-pay | Admitting: Family Medicine

## 2020-06-08 DIAGNOSIS — E119 Type 2 diabetes mellitus without complications: Secondary | ICD-10-CM

## 2020-06-09 MED ORDER — FREESTYLE TEST VI STRP: ORAL_STRIP | 12 refills | Status: DC

## 2020-12-23 ENCOUNTER — Other Ambulatory Visit: Payer: Self-pay

## 2020-12-24 ENCOUNTER — Ambulatory Visit (INDEPENDENT_AMBULATORY_CARE_PROVIDER_SITE_OTHER): Payer: Commercial Managed Care - PPO | Admitting: Family Medicine

## 2020-12-24 ENCOUNTER — Encounter: Payer: Self-pay | Admitting: Family Medicine

## 2020-12-24 VITALS — BP 130/84 | HR 82 | Temp 97.8°F | Ht 72.0 in | Wt >= 6400 oz

## 2020-12-24 DIAGNOSIS — E119 Type 2 diabetes mellitus without complications: Secondary | ICD-10-CM | POA: Diagnosis not present

## 2020-12-24 DIAGNOSIS — E7800 Pure hypercholesterolemia, unspecified: Secondary | ICD-10-CM

## 2020-12-24 DIAGNOSIS — Z2821 Immunization not carried out because of patient refusal: Secondary | ICD-10-CM

## 2020-12-24 DIAGNOSIS — I10 Essential (primary) hypertension: Secondary | ICD-10-CM

## 2020-12-24 LAB — COMPREHENSIVE METABOLIC PANEL
ALT: 29 U/L (ref 0–53)
AST: 17 U/L (ref 0–37)
Albumin: 4.4 g/dL (ref 3.5–5.2)
Alkaline Phosphatase: 70 U/L (ref 39–117)
BUN: 14 mg/dL (ref 6–23)
CO2: 30 mEq/L (ref 19–32)
Calcium: 9.9 mg/dL (ref 8.4–10.5)
Chloride: 98 mEq/L (ref 96–112)
Creatinine, Ser: 0.96 mg/dL (ref 0.40–1.50)
GFR: 105.3 mL/min (ref >60.00)
Glucose, Bld: 259 mg/dL — ABNORMAL HIGH (ref 70–99)
Potassium: 4.5 mEq/L (ref 3.5–5.1)
Sodium: 133 mEq/L — ABNORMAL LOW (ref 135–145)
Total Bilirubin: 0.5 mg/dL (ref 0.2–1.2)
Total Protein: 7.6 g/dL (ref 6.0–8.3)

## 2020-12-24 LAB — MICROALBUMIN / CREATININE URINE RATIO
Creatinine,U: 119.8 mg/dL
Microalb Creat Ratio: 9.3 mg/g (ref 0.0–30.0)
Microalb, Ur: 11.1 mg/dL — ABNORMAL HIGH (ref 0.0–1.9)

## 2020-12-24 LAB — LIPID PANEL
Cholesterol: 262 mg/dL — ABNORMAL HIGH (ref 0–200)
HDL: 38.7 mg/dL — ABNORMAL LOW (ref >39.00)
LDL Cholesterol: 193 mg/dL — ABNORMAL HIGH (ref 0–99)
NonHDL: 222.98
Total CHOL/HDL Ratio: 7
Triglycerides: 152 mg/dL — ABNORMAL HIGH (ref 0.0–149.0)
VLDL: 30.4 mg/dL (ref 0.0–40.0)

## 2020-12-24 LAB — CBC
HCT: 46.6 % (ref 39.0–52.0)
Hemoglobin: 15.7 g/dL (ref 13.0–17.0)
MCHC: 33.8 g/dL (ref 30.0–36.0)
MCV: 86.1 fl (ref 78.0–100.0)
Platelets: 209 10*3/uL (ref 150.0–400.0)
RBC: 5.41 Mil/uL (ref 4.22–5.81)
RDW: 13.2 % (ref 11.5–15.5)
WBC: 6.3 10*3/uL (ref 4.0–10.5)

## 2020-12-24 LAB — HEMOGLOBIN A1C: Hgb A1c MFr Bld: 10.3 % — ABNORMAL HIGH (ref 4.6–6.5)

## 2020-12-24 NOTE — Progress Notes (Signed)
Chief Complaint  Patient presents with  . Follow-up    F/u DM/chol and meds.   Declines flu shot.     HPI: *James Quinn is a 30 y.o. male here for DM, HLD, HTN follow-up.  Pt does check BS at home. Readings: highest in past few months 165 and lowest 128 Hypoglycemia/Hypergylcemic episodes: no  For DM, pt takes metformin 1525m daily. He is taking 880mASA. For HTN, pt was on losartan 5078mn the past but is no longer taking at BP has been at goal.  Pt is Rx'd lipitor 35m28mily but is not taking it.   Diet: lean protein and green veggies, limiting carbs Exercise: goes to gym 3x/wk  Lab Results  Component Value Date   HGBA1C 10.6 (H) 05/15/2020   No results found for: MICRDerl Barrow Results  Component Value Date   CREATININE 1.24 05/13/2020   Lab Results  Component Value Date   CHOL 237 (H) 07/19/2019   HDL 35.60 (L) 07/19/2019   LDLCALC 171 (H) 07/19/2019   TRIG 150.0 (H) 07/19/2019   CHOLHDL 7 07/19/2019    The ASCVD Risk score (GofMikey BussingJr., et al., 2013) failed to calculate for the following reasons:   The 2013 ASCVD risk score is only valid for ages 40 t7879  [address]ast Medical History:  Diagnosis Date  . Arthritis    lower back  . Eczema     No past surgical history on file.  Social History   Socioeconomic History  . Marital status: Married    Spouse name: Not on file  . Number of children: 0  . Years of education: 14  39Highest education level: Not on file  Occupational History  . 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 Smoking status: Never Smoker  . Smokeless tobacco: Never Used  Vaping Use  . Vaping Use: Never used  Substance and Sexual Activity  . Alcohol use: Yes    Comment: occ  . Drug use: No  . Sexual activity: Yes    Partners: Female    Birth control/protection: Condom  Other Topics Concern  . Not on file  Social History Narrative   Fun: Workout, movies, go out bowling.   Denies religious beliefs  effecting healthcare.    Social Determinants of Health   Financial Resource Strain: Not on file  Food Insecurity: Not on file  Transportation Needs: Not on file  Physical Activity: Not on file  Stress: Not on file  Social Connections: Not on file  Intimate Partner Violence: Not on file    Family History  Problem Relation Age of Onset  . Diabetes Sister   . Congestive Heart Failure Sister   . Kidney failure Sister   . Thyroid disease Sister   . Arthritis Father   . Hypertension Father   . Arthritis Maternal Grandmother   . Prostate cancer Maternal Grandfather   . Cancer Maternal Grandfather        colon cancer  . Arthritis Paternal Grandmother   . Stroke Mother      Immunization History  Administered Date(s) Administered  . PFIZER(Purple Top)SARS-COV-2 Vaccination 06/01/2020, 06/22/2020  . Tdap 04/10/2015    Outpatient Encounter Medications as of 12/24/2020  Medication Sig  . aspirin EC 81 MG tablet Take 1 tablet (81 mg total) by mouth daily.  . glMarland Kitchencose blood (FREESTYLE TEST STRIPS) test strip Use to check blood sugars up to 4 times a day.  . glMarland Kitchencose monitoring  kit (FREESTYLE) monitoring kit 1 each by Does not apply route as needed for other. Use with test strips and lancets to check BS BID  . Lancets (FREESTYLE) lancets Use as instructed  . metFORMIN (GLUCOPHAGE XR) 750 MG 24 hr tablet 1 tab po daily x 1 week then increase to 2 tabs po daily  . atorvastatin (LIPITOR) 20 MG tablet Take 1 tablet (20 mg total) by mouth daily. (Patient not taking: No sig reported)  . [DISCONTINUED] Cayenne 450 MG CAPS Take 450 mg by mouth daily. (Patient not taking: Reported on 05/15/2020)  . [DISCONTINUED] Cinnamon 500 MG capsule Take 500 mg by mouth daily. (Patient not taking: Reported on 05/15/2020)  . [DISCONTINUED] doxycycline (VIBRAMYCIN) 100 MG capsule Take 1 capsule (100 mg total) by mouth 2 (two) times daily. (Patient not taking: Reported on 12/24/2020)  . [DISCONTINUED] fluticasone  (FLOVENT HFA) 110 MCG/ACT inhaler Inhale 2 puffs into the lungs in the morning and at bedtime. (Patient not taking: Reported on 12/24/2020)  . [DISCONTINUED] losartan (COZAAR) 50 MG tablet Take 1 tablet (50 mg total) by mouth daily. (Patient not taking: No sig reported)   No facility-administered encounter medications on file as of 12/24/2020.     ROS: Pertinent positives and negatives noted in HPI. Remainder of ROS non-contributory   No Known Allergies  BP 130/84   Pulse 82   Temp 97.8 F (36.6 C) (Temporal)   Ht 6' (1.829 m)   Wt (!) 485 lb 3.2 oz (220.1 kg)   SpO2 97%   BMI 65.80 kg/m    Wt Readings from Last 3 Encounters:  12/24/20 (!) 485 lb 3.2 oz (220.1 kg)  05/15/20 (!) 463 lb 9.6 oz (210.3 kg)  05/13/20 (!) 491 lb 10 oz (223 kg)   Temp Readings from Last 3 Encounters:  12/24/20 97.8 F (36.6 C) (Temporal)  05/15/20 97.9 F (36.6 C)  05/13/20 (!) 101.3 F (38.5 C) (Oral)   BP Readings from Last 3 Encounters:  12/24/20 130/84  05/15/20 126/86  05/13/20 (!) 158/101   Pulse Readings from Last 3 Encounters:  12/24/20 82  05/15/20 (!) 116  05/13/20 97     Physical Exam Constitutional:      General: He is not in acute distress.    Appearance: He is obese. He is not ill-appearing.  Cardiovascular:     Rate and Rhythm: Normal rate and regular rhythm.     Pulses: Normal pulses.     Heart sounds: Normal heart sounds.  Pulmonary:     Effort: Pulmonary effort is normal.     Breath sounds: Normal breath sounds. No wheezing or rhonchi.  Musculoskeletal:     Right lower leg: No edema.     Left lower leg: No edema.  Neurological:     General: No focal deficit present.     Mental Status: He is alert and oriented to person, place, and time.  Psychiatric:        Mood and Affect: Mood normal.        Behavior: Behavior normal.      A/P:  1. Newly diagnosed diabetes ([place]) Lab Results  Component Value Date   HGBA1C 10.6 (H) 05/15/2020  - on metformin 1536m  daily - home BS 120's - 160's - pt is going to gym 3x/wk and working to improve diet - Hemoglobin A1c - Microalbumin / creatinine urine ratio  2. Hypercholesterolemia - not on statin, previously Rx'd lipitor 286m On last FLP, LDL not at goal  -  Lipid panel - pt will likely need to restart statin but will determine best med and dose based on result of today's FLP  3. Essential hypertension - controlled, at goal - not on medication and no need for Rx at this time - CBC - Comprehensive metabolic panel  4. Influenza vaccination declined by patient  5. Morbid obesity ([place]) - pt is going to gym 3x/wk and working to improve diet since 11/21/20 - Hemoglobin A1c - Lipid panel - Comprehensive metabolic panel  This visit occurred during the SARS-CoV-2 public health emergency.  Safety protocols were in place, including screening questions prior to the visit, additional usage of staff PPE, and extensive cleaning of exam room while observing appropriate contact time as indicated for disinfecting solutions.

## 2020-12-25 ENCOUNTER — Ambulatory Visit: Payer: Commercial Managed Care - PPO | Admitting: Family Medicine

## 2020-12-28 ENCOUNTER — Encounter: Payer: Self-pay | Admitting: Family Medicine

## 2020-12-28 DIAGNOSIS — I10 Essential (primary) hypertension: Secondary | ICD-10-CM

## 2020-12-28 DIAGNOSIS — E119 Type 2 diabetes mellitus without complications: Secondary | ICD-10-CM

## 2020-12-28 DIAGNOSIS — E7800 Pure hypercholesterolemia, unspecified: Secondary | ICD-10-CM

## 2020-12-29 NOTE — Telephone Encounter (Signed)
Please see message patient sent regarding labs.   Thanks.  Dm/cma

## 2020-12-30 MED ORDER — LISINOPRIL 2.5 MG PO TABS: 2.5000 mg | ORAL_TABLET | Freq: Every day | ORAL | 3 refills | Status: DC

## 2020-12-30 MED ORDER — ATORVASTATIN CALCIUM 80 MG PO TABS: 80.0000 mg | ORAL_TABLET | Freq: Every day | ORAL | 3 refills | Status: DC

## 2020-12-30 MED ORDER — OZEMPIC (0.25 OR 0.5 MG/DOSE) 2 MG/1.5ML ~~LOC~~ SOPN: 0.5000 mg | PEN_INJECTOR | SUBCUTANEOUS | 1 refills | Status: DC

## 2020-12-31 ENCOUNTER — Telehealth: Payer: Self-pay

## 2020-12-31 NOTE — Telephone Encounter (Signed)
PA for the Ozempic submitted through cover my meds by fax.  awaiting response. Dm/cma  Key: [id number]

## 2021-01-07 NOTE — Telephone Encounter (Signed)
PA was approved per Cover my meds. Dm/cma

## 2021-04-23 ENCOUNTER — Ambulatory Visit: Payer: Commercial Managed Care - PPO | Admitting: Family Medicine

## 2021-05-21 ENCOUNTER — Encounter: Payer: Self-pay | Admitting: Family Medicine

## 2021-05-21 ENCOUNTER — Other Ambulatory Visit: Payer: Self-pay

## 2021-05-21 ENCOUNTER — Ambulatory Visit (INDEPENDENT_AMBULATORY_CARE_PROVIDER_SITE_OTHER): Payer: Commercial Managed Care - PPO | Admitting: Family Medicine

## 2021-05-21 VITALS — BP 138/88 | HR 68 | Temp 97.1°F | Ht 72.0 in | Wt >= 6400 oz

## 2021-05-21 DIAGNOSIS — I10 Essential (primary) hypertension: Secondary | ICD-10-CM

## 2021-05-21 DIAGNOSIS — E119 Type 2 diabetes mellitus without complications: Secondary | ICD-10-CM | POA: Diagnosis not present

## 2021-05-21 DIAGNOSIS — E7800 Pure hypercholesterolemia, unspecified: Secondary | ICD-10-CM

## 2021-05-21 LAB — POCT GLYCOSYLATED HEMOGLOBIN (HGB A1C): Hemoglobin A1C: 10.3 % — AB (ref 4.0–5.6)

## 2021-05-21 MED ORDER — METFORMIN HCL ER 750 MG PO TB24: 1500.0000 mg | ORAL_TABLET | Freq: Every day | ORAL | 3 refills | Status: DC

## 2021-05-21 MED ORDER — FREESTYLE TEST VI STRP: ORAL_STRIP | 12 refills | Status: DC

## 2021-05-21 MED ORDER — FREESTYLE LANCETS MISC: 12 refills | Status: DC

## 2021-05-21 MED ORDER — PIOGLITAZONE HCL 30 MG PO TABS: 30.0000 mg | ORAL_TABLET | Freq: Every day | ORAL | 3 refills | Status: DC

## 2021-05-21 NOTE — Progress Notes (Signed)
Chief Complaint  Patient presents with   Follow-up    4 mo f/u diabetes, pt checks blood sugars at home and it ranges 133-185.    HPI: James Quinn is a 30 y.o. male here for DM, HLD, HTN follow-up. For DM pt is taking metformin 1523m daily. He was Rx'd ozempic but did not take states it was too expensive.  For HLD, pt is taking lipitor 842mdaily (started in 12/2020). For HTN, pt is taking lisinopril 2.58m48maily. He also takes a 62m63mA daily.  Pt does check BS at home. Readings: 133-185. Highest reading in past few months 220. Highest last week 177.  Goes to gym 4x/wk. Pt has lost 16lbs since last OV. He has more energy.  Pt has not had fast food in 1 mo! He has also cut out alcohol in the past month.   Lab Results  Component Value Date   HGBA1C 10.3 (A) 05/21/2021  Previously 10.3 in 12/2020  Lab Results  Component Value Date   MICROALBUR 11.1 (H) 12/24/2020   Lab Results  Component Value Date   CREATININE 0.96 12/24/2020   Lab Results  Component Value Date   CHOL 262 (H) 12/24/2020   HDL 38.70 (L) 12/24/2020   LDLCALC 193 (H) 12/24/2020   TRIG 152.0 (H) 12/24/2020   CHOLHDL 7 12/24/2020    The ASCVD Risk score (GofMikey BussingJr., et al., 2013) failed to calculate for the following reasons:   The 2013 ASCVD risk score is only valid for ages 40 t5379  5ast Medical History:  Diagnosis Date   Arthritis    lower back   Eczema     History reviewed. No pertinent surgical history.  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   Denies religious beliefs effecting healthcare.    Social Determinants of Health   Financial Resource Strain: Not on file  Food Insecurity: Not on file  Transportation Needs: Not on file  Physical Activity: Not on file  Stress: Not on file  Social Connections: Not on file  Intimate Partner Violence: Not on file    Family History  Problem Relation Age of Onset   Diabetes Sister    Congestive Heart Failure Sister    Kidney failure Sister    Thyroid disease Sister    Arthritis Father    Hypertension Father    Arthritis Maternal Grandmother    Prostate cancer Maternal Grandfather    Cancer Maternal Grandfather        colon cancer   Arthritis Paternal Grandmother    Stroke Mother      Immunization History  Administered Date(s) Administered   PFIZER(Purple Top)SARS-COV-2 Vaccination 06/01/2020, 06/22/2020   Tdap 04/10/2015    Outpatient Encounter Medications as of 05/21/2021  Medication Sig  aspirin EC 81 MG tablet Take 1 tablet (81 mg total) by mouth daily.   atorvastatin (LIPITOR) 80 MG tablet Take 1 tablet (80 mg total) by mouth daily.   glucose monitoring kit (FREESTYLE) monitoring kit 1 each by Does not apply route as needed for other. Use with test strips and lancets to check BS BID   lisinopril (ZESTRIL) 2.5 MG tablet Take 1 tablet (2.5 mg total) by mouth daily.   pioglitazone (ACTOS) 30 MG tablet Take 1 tablet (30 mg total) by mouth daily.   [DISCONTINUED] glucose blood (FREESTYLE TEST STRIPS) test strip Use to check blood sugars up to 4 times a day.   [DISCONTINUED] Lancets (FREESTYLE) lancets Use as instructed   [DISCONTINUED] metFORMIN (GLUCOPHAGE XR) 750 MG 24 hr tablet 1 tab po daily x 1 week then increase to 2 tabs po daily   glucose blood (FREESTYLE TEST STRIPS) test strip Use to check blood sugars up to 4 times a day.   Lancets  (FREESTYLE) lancets Use as instructed   metFORMIN (GLUCOPHAGE XR) 750 MG 24 hr tablet Take 2 tablets (1,500 mg total) by mouth daily with breakfast.   [DISCONTINUED] Semaglutide,0.25 or 0.5MG/DOS, (OZEMPIC, 0.25 OR 0.5 MG/DOSE,) 2 MG/1.5ML SOPN Inject 0.5 mg into the skin once a week. (Patient not taking: Reported on 05/21/2021)   No facility-administered encounter medications on file as of 05/21/2021.     ROS: Pertinent positives and negatives noted in HPI. Remainder of ROS non-contributory  No Known Allergies  BP 138/88   Pulse 68   Temp (!) 97.1 F (36.2 C) (Temporal)   Ht 6' (1.829 m)   Wt (!) 469 lb 12.8 oz (213.1 kg)   SpO2 97%   BMI 63.72 kg/m  Wt Readings from Last 3 Encounters:  05/21/21 (!) 469 lb 12.8 oz (213.1 kg)  12/24/20 (!) 485 lb 3.2 oz (220.1 kg)  05/15/20 (!) 463 lb 9.6 oz (210.3 kg)   Temp Readings from Last 3 Encounters:  05/21/21 (!) 97.1 F (36.2 C) (Temporal)  12/24/20 97.8 F (36.6 C) (Temporal)  05/15/20 97.9 F (36.6 C)   BP Readings from Last 3 Encounters:  05/21/21 138/88  12/24/20 130/84  05/15/20 126/86   Pulse Readings from Last 3 Encounters:  05/21/21 68  12/24/20 82  05/15/20 (!) 116     Physical Exam Constitutional:      General: He is not in acute distress.    Appearance: He is obese. He is not ill-appearing.  Cardiovascular:     Rate and Rhythm: Normal rate and regular rhythm.     Pulses: Normal pulses.  Pulmonary:     Effort: Pulmonary effort is normal.     Breath sounds: Normal breath sounds.  Neurological:     Mental Status: He is alert and oriented to person, place, and time.     A/P:  1. Newly diagnosed diabetes ([place]) - POCT HgB A1C = 10.3 (was 10.3 in 12/2020) - pt never filled/took ozempic d/t cost but did not let me know - congratulated pt on weight loss and regular exercise and improved diet!! Rx: - pioglitazone (ACTOS) 30 MG tablet; Take 1 tablet (30 mg total) by mouth daily.  Dispense: 90 tablet; Refill:  3 Refill: - metFORMIN (GLUCOPHAGE XR) 750 MG 24 hr tablet; Take 2 tablets (1,500 mg total) by mouth daily with breakfast.  Dispense: 180 tablet; Refill: 3 - glucose blood (FREESTYLE TEST STRIPS) test strip; Use to check blood sugars up to 4 times a day.  Dispense: 100 each; Refill: 12 - Lancets (FREESTYLE) lancets; Use as instructed  Dispense: 100 each; Refill: 12 - f/u in 3 mo  2. Hypercholesterolemia - pt is on lipitor 59m daily - FLP at next OV in 3 mo  3. Essential hypertension - controlled, at goal - cont lisinopril 2.557mdaily - f/u in 3 mo  This visit occurred during the SARS-CoV-2 public health emergency.  Safety protocols were in place, including screening questions prior to the visit, additional usage of staff PPE, and extensive cleaning of exam room while observing appropriate contact time as indicated for disinfecting solutions.

## 2021-10-16 IMAGING — CR DG CHEST 2V
2 series · 2 of 2 positions shown · non-contrast
Comparison: 09/18/2016

CLINICAL DATA: Hyperglycemia and headache fever

EXAM:
CHEST - 2 VIEW

[w chest pa]
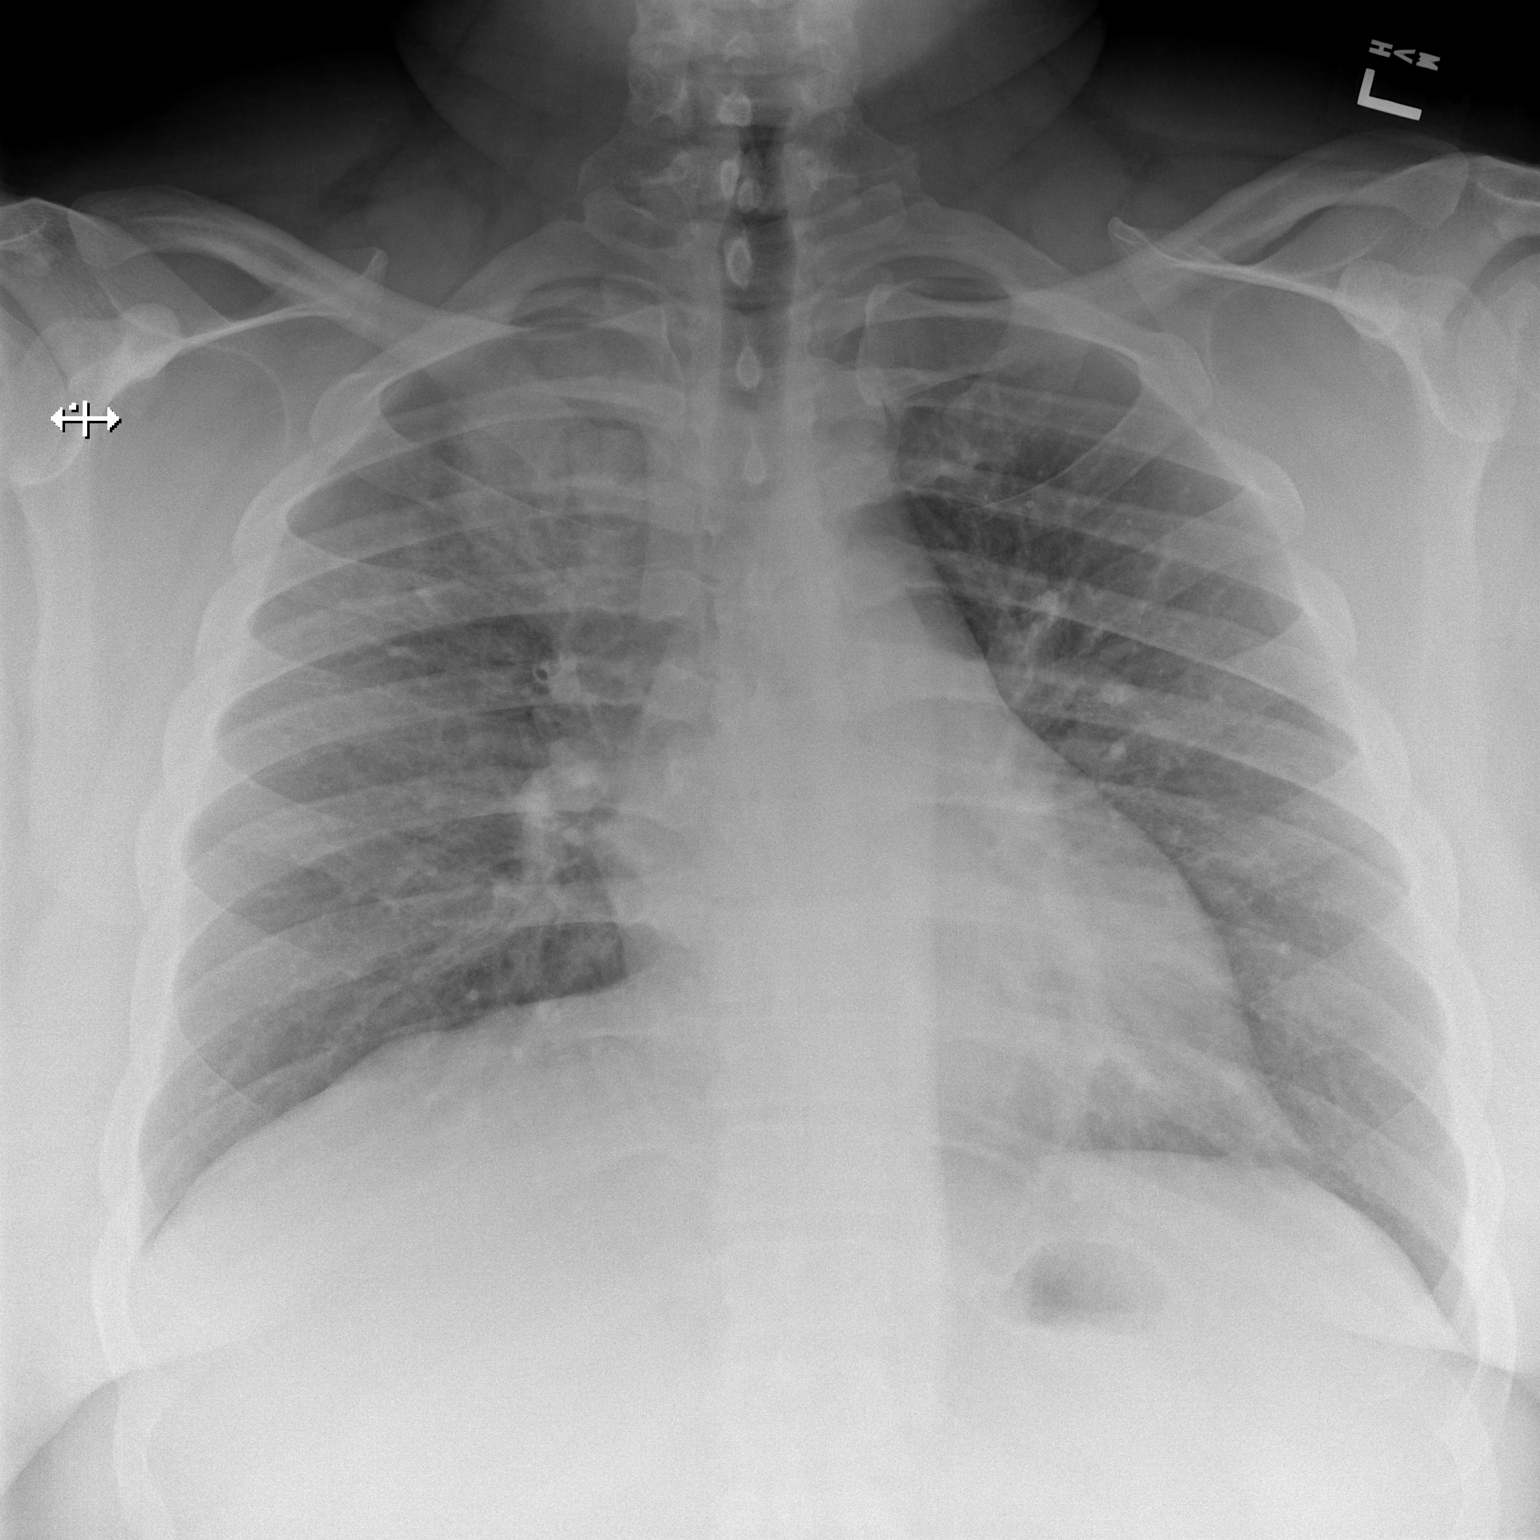

[w chest lat]
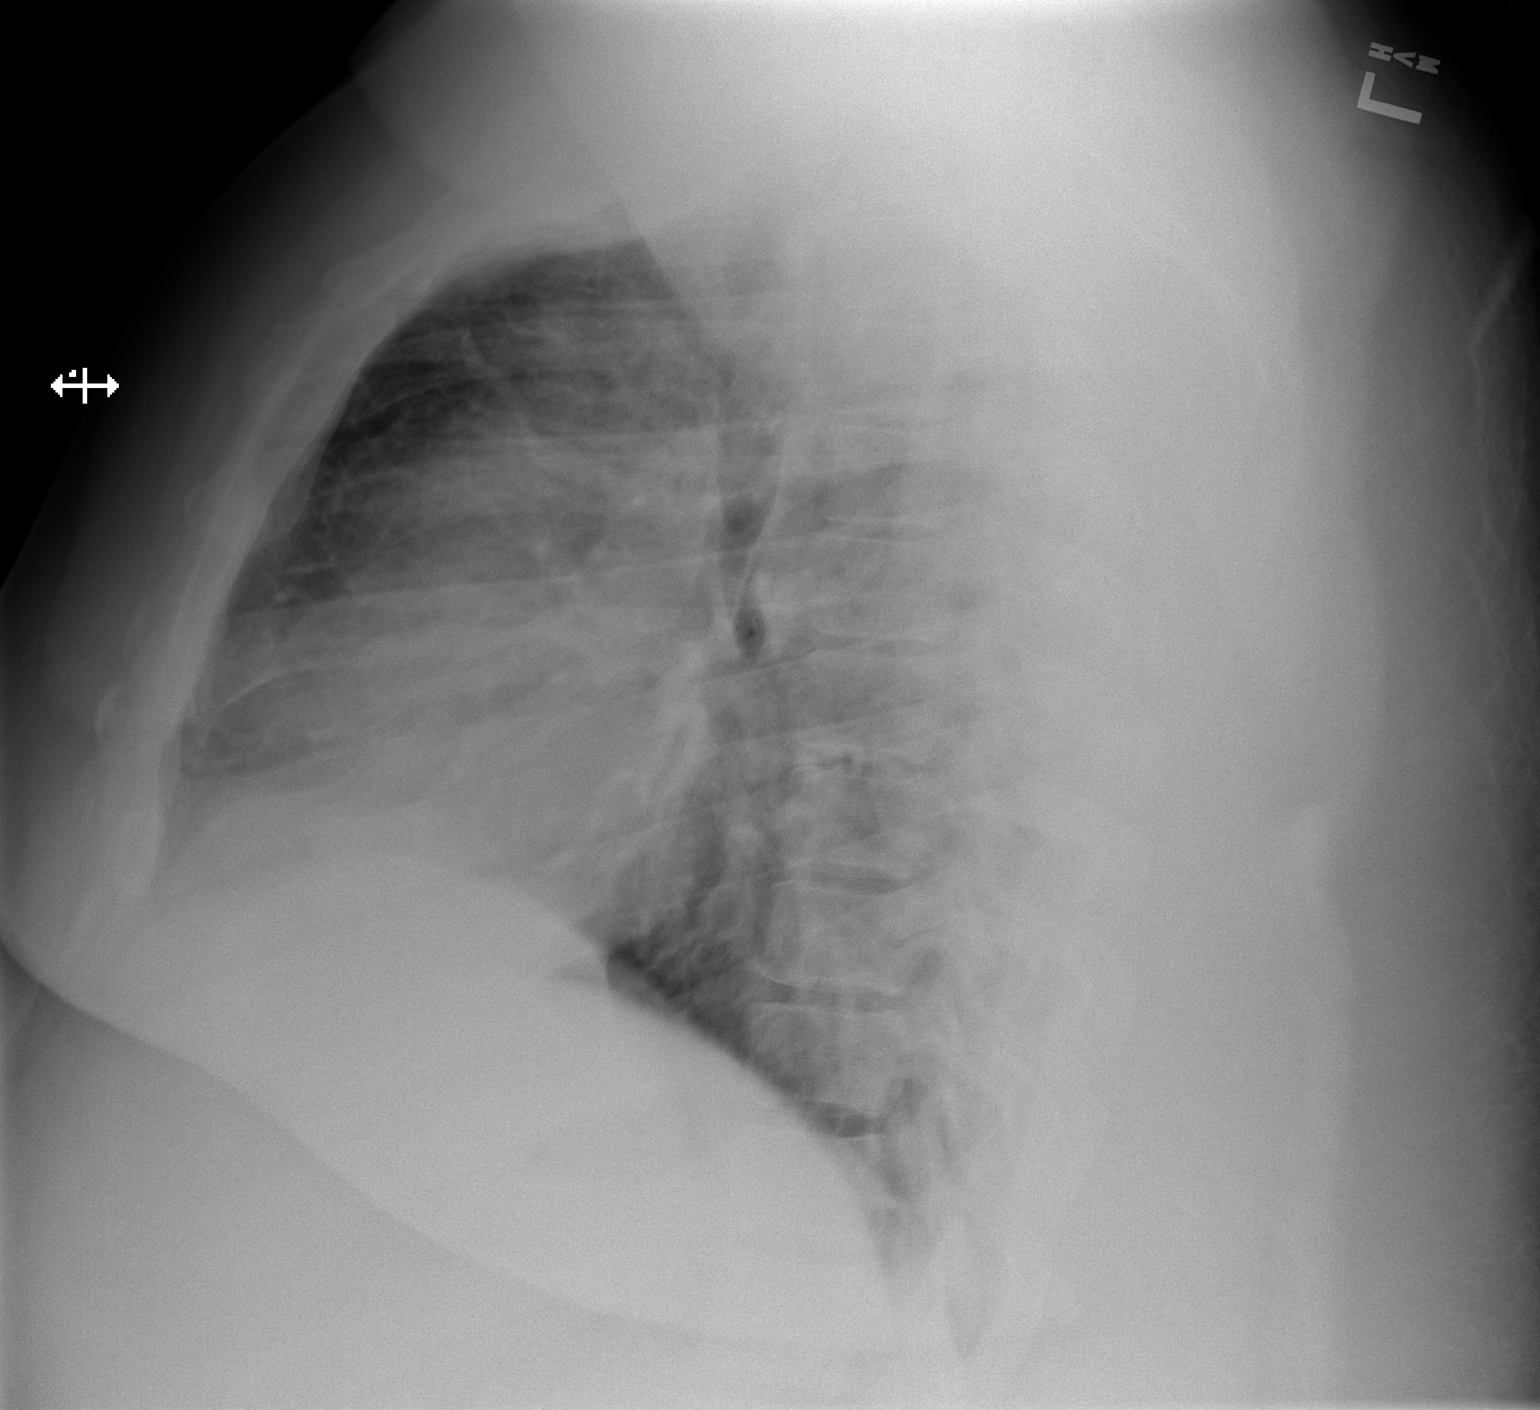

[2 of 2 positions shown; findings below may reference images not displayed]

FINDINGS: Right upper lobe opacity with suggestion of mild air bronchograms.
No pleural effusion. Normal heart size. No pneumothorax.
IMPRESSION: Right upper lobe opacity, suspicious for pneumonia.

## 2021-10-17 IMAGING — DX DG CHEST 1V PORT
2 series · 3 of 3 positions shown · non-contrast
Comparison: 05/12/2020

CLINICAL DATA: Fever

EXAM:
PORTABLE CHEST 1 VIEW

[chest ap (1 of 2)]
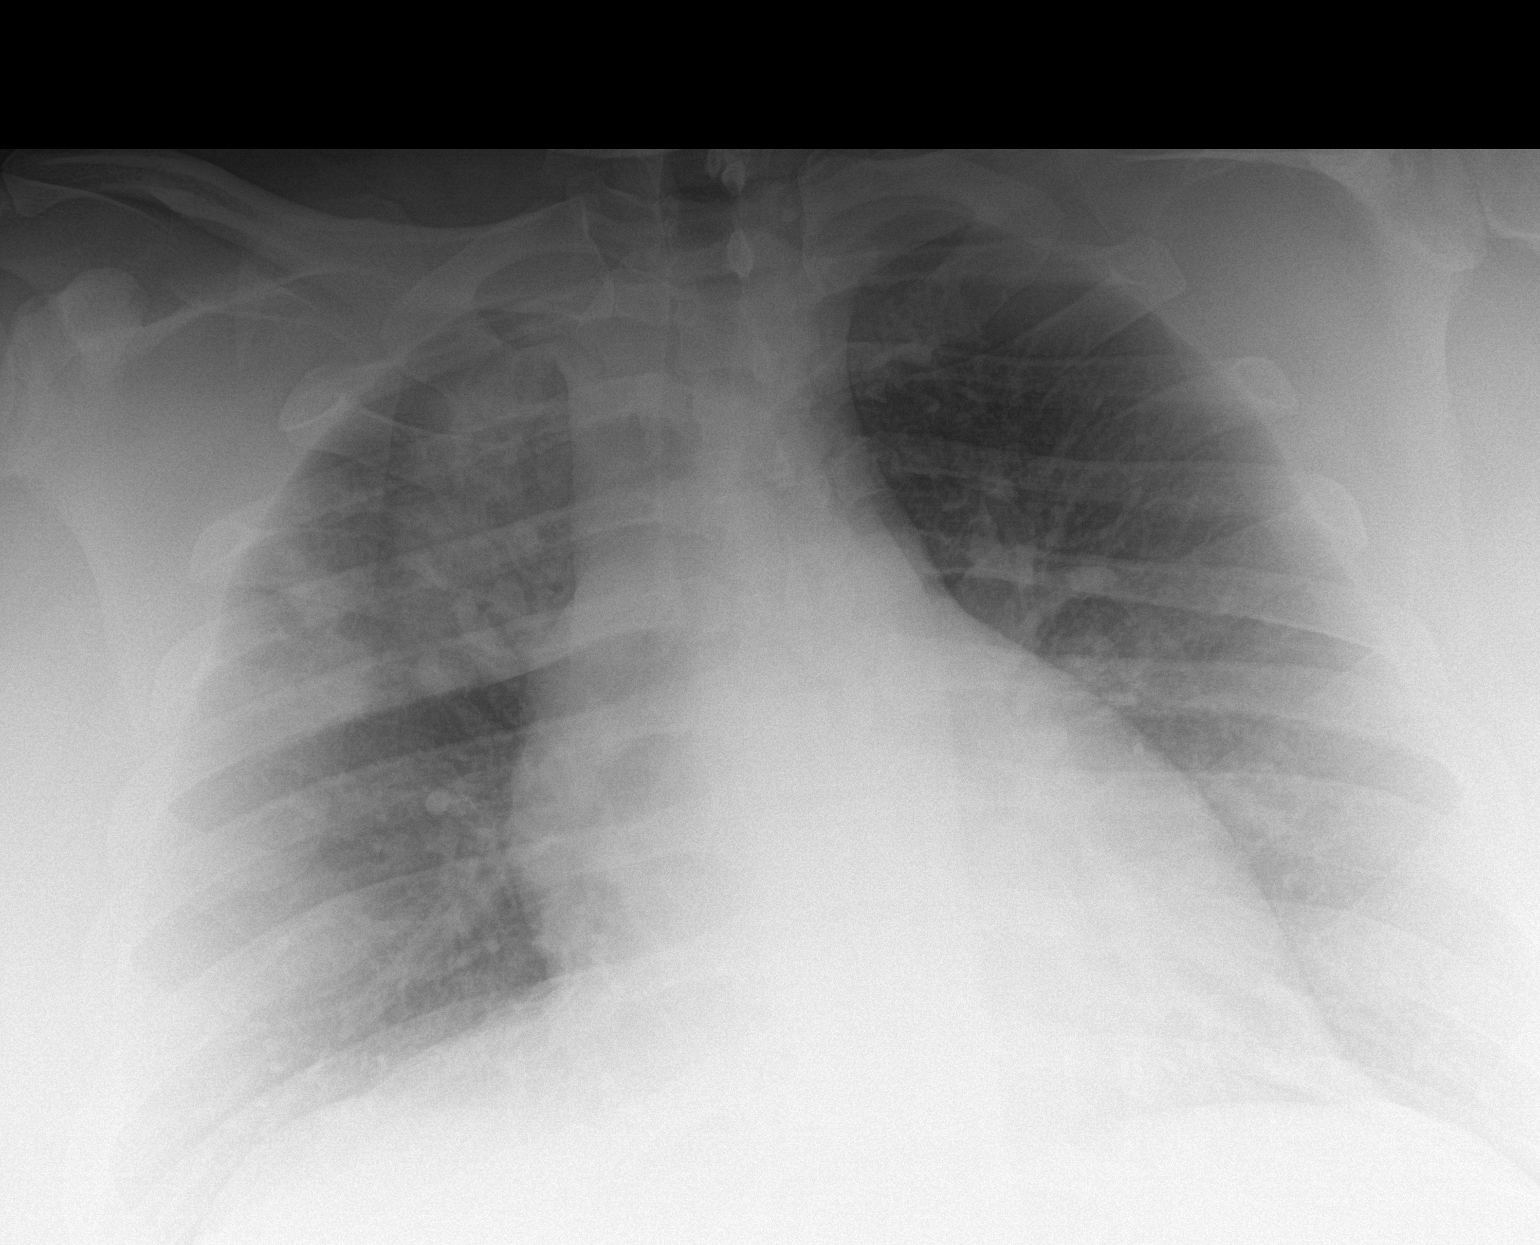

[Series 2: chest ap · 0.14mm/px · 2 of 2 slices shown (2 of 2)]
[im 1/2]
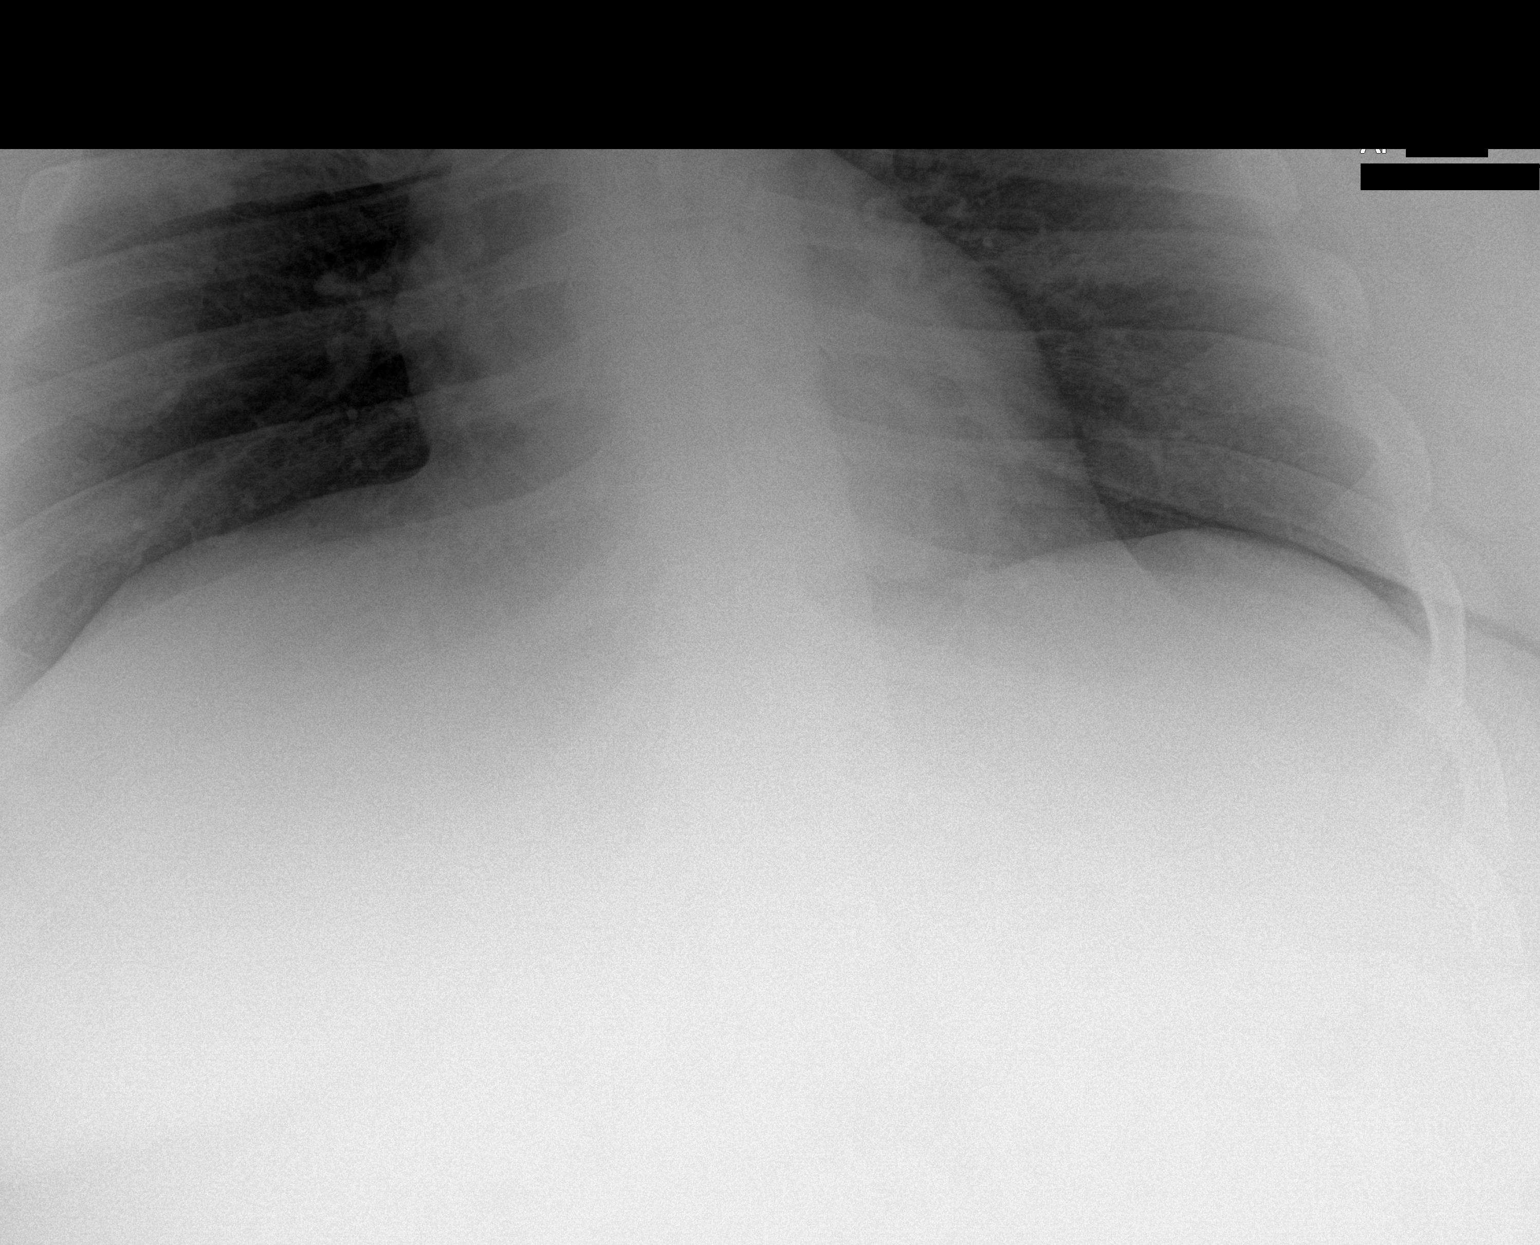
[im 2/2]
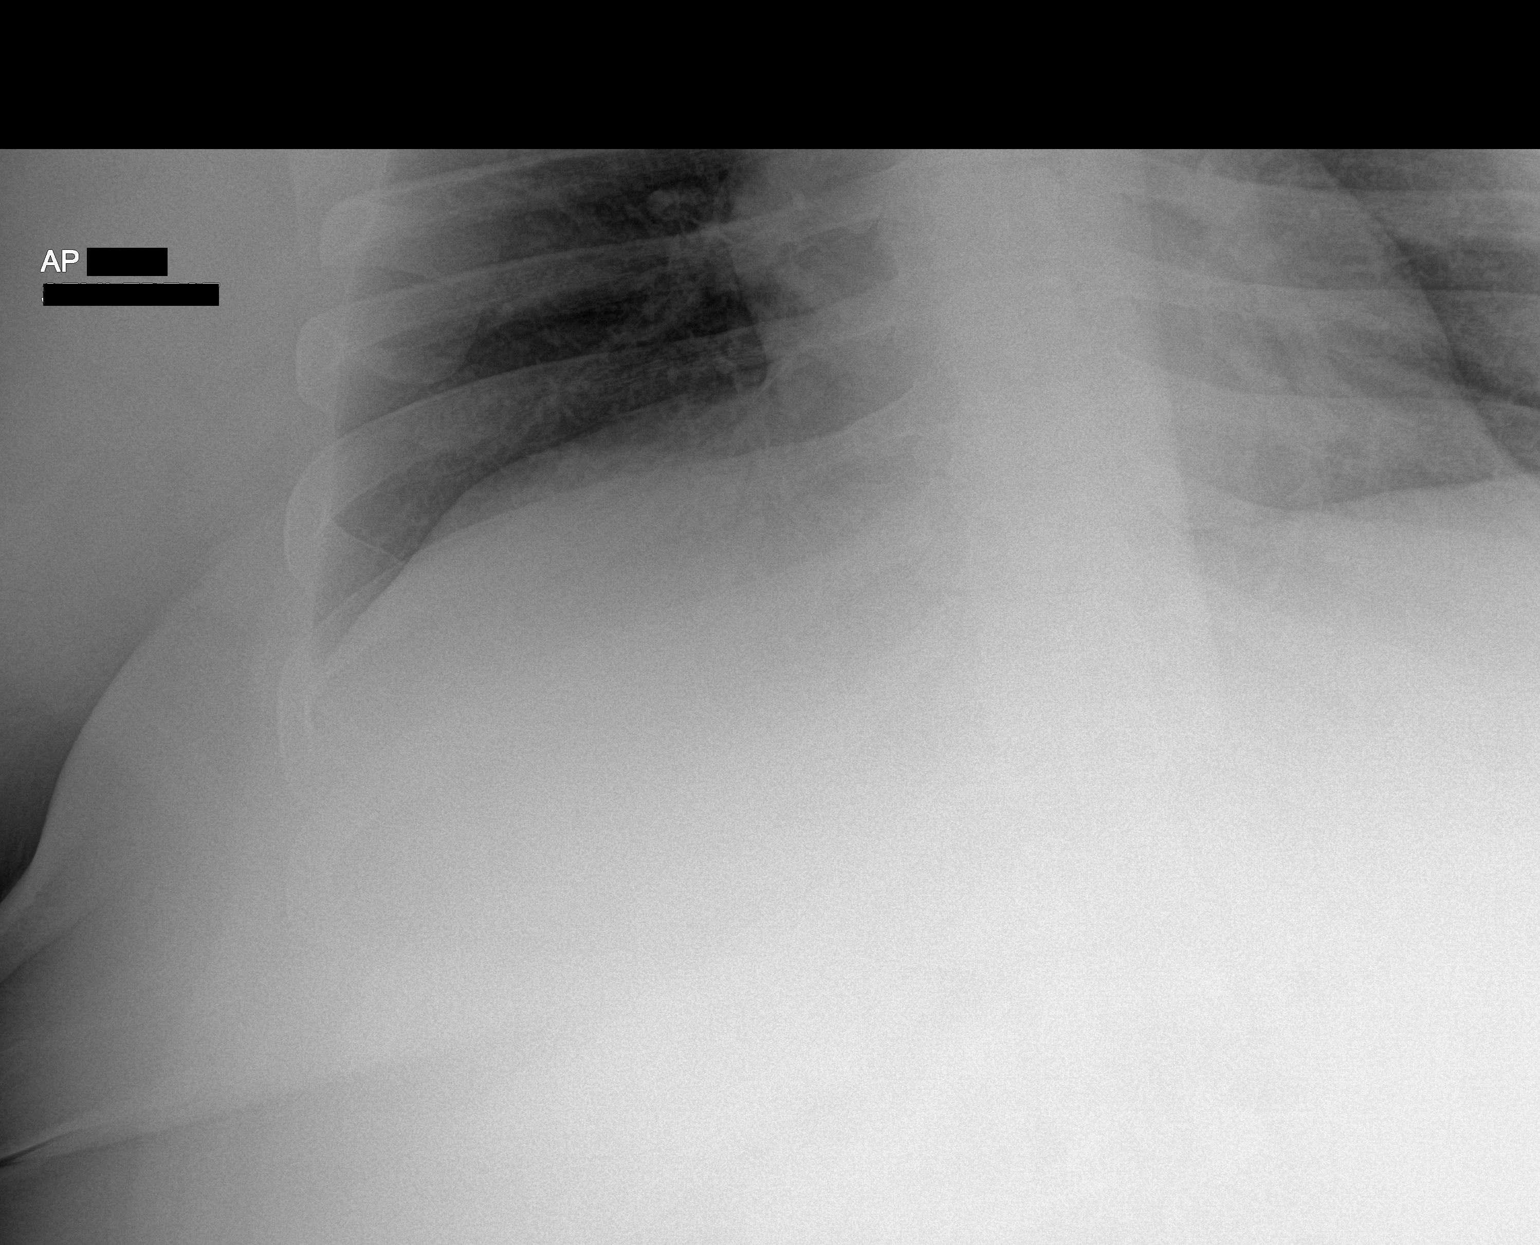

[3 of 3 positions shown; findings below may reference images not displayed]

FINDINGS: Right upper lobe consolidative opacities again noted. Stable mild
elevation of the right hemidiaphragm. No significant pleural
effusion. Stable cardiomediastinal contours.
IMPRESSION: Persistent right upper lobe consolidation likely reflecting
pneumonia.

## 2021-11-24 ENCOUNTER — Encounter: Payer: Commercial Managed Care - PPO | Admitting: Family Medicine

## 2022-02-22 ENCOUNTER — Encounter: Payer: Commercial Managed Care - PPO | Admitting: Family Medicine

## 2022-02-22 ENCOUNTER — Telehealth: Payer: Self-pay | Admitting: Family Medicine

## 2022-02-22 NOTE — Telephone Encounter (Signed)
No show letter printed and mailed 02/22/22 AD.  ?

## 2022-03-01 NOTE — Telephone Encounter (Signed)
1st no show, fee waived ?

## 2022-11-22 ENCOUNTER — Ambulatory Visit (INDEPENDENT_AMBULATORY_CARE_PROVIDER_SITE_OTHER): Payer: No Typology Code available for payment source | Admitting: Family Medicine

## 2022-11-22 ENCOUNTER — Encounter: Payer: Self-pay | Admitting: Family Medicine

## 2022-11-22 VITALS — BP 158/90 | HR 80 | Ht 72.0 in | Wt >= 6400 oz

## 2022-11-22 DIAGNOSIS — I10 Essential (primary) hypertension: Secondary | ICD-10-CM | POA: Diagnosis not present

## 2022-11-22 DIAGNOSIS — E119 Type 2 diabetes mellitus without complications: Secondary | ICD-10-CM

## 2022-11-22 DIAGNOSIS — Z114 Encounter for screening for human immunodeficiency virus [HIV]: Secondary | ICD-10-CM

## 2022-11-22 DIAGNOSIS — E7849 Other hyperlipidemia: Secondary | ICD-10-CM

## 2022-11-22 DIAGNOSIS — Z1159 Encounter for screening for other viral diseases: Secondary | ICD-10-CM

## 2022-11-22 DIAGNOSIS — Z23 Encounter for immunization: Secondary | ICD-10-CM

## 2022-11-22 DIAGNOSIS — E038 Other specified hypothyroidism: Secondary | ICD-10-CM

## 2022-11-22 DIAGNOSIS — E559 Vitamin D deficiency, unspecified: Secondary | ICD-10-CM

## 2022-11-22 HISTORY — DX: Essential (primary) hypertension: I10

## 2022-11-22 HISTORY — DX: Type 2 diabetes mellitus without complications: E11.9

## 2022-11-22 MED ORDER — OLMESARTAN MEDOXOMIL 20 MG PO TABS: 20.0000 mg | ORAL_TABLET | Freq: Every day | ORAL | 2 refills | Status: DC

## 2022-11-22 MED ORDER — METFORMIN HCL 500 MG PO TABS: 500.0000 mg | ORAL_TABLET | Freq: Every day | ORAL | 2 refills | Status: DC

## 2022-11-22 NOTE — Assessment & Plan Note (Signed)
He reports taking metformin 500 mg daily He denies polyuria, polyphagia, polydipsia Will assess hemoglobin A1c today and make adjustments to his medications as needed Patient is aware of plan of care and verbalized understanding

## 2022-11-22 NOTE — Assessment & Plan Note (Signed)
Patient educated on CDC recommendation for the vaccine. Verbal consent was obtained from the patient, vaccine administered by nurse, no sign of adverse reactions noted at this time. Patient education on arm soreness and use of tylenol or ibuprofen for this patient  was discussed. Patient educated on the signs and symptoms of adverse effect and advise to contact the office if they occur.  

## 2022-11-22 NOTE — Assessment & Plan Note (Signed)
Uncontrolled He reports taking lisinopril 2.5 mg however he has not been on treatment regimen for about a year He reports occasional headaches, visual disturbances We will start patient on olmesartan 20 mg daily Encourage low-sodium diet with increased physical activities BP Readings from Last 3 Encounters:  11/22/22 (!) 158/90  05/21/21 138/88  12/24/20 130/84

## 2022-11-22 NOTE — Patient Instructions (Signed)
I appreciate the opportunity to provide care to you today!    Follow up:  1 months BP  Labs: please stop by the lab today to get your blood drawn (CBC, CMP, TSH, Lipid profile, HgA1c, Vit D)  Screening: HIV and Hep C  Pick up your medications at the pharmacy  Please continue to a heart-healthy diet and increase your physical activities  Physical activity helps: Lower your blood glucose, improve your heart health, lower your blood pressure and cholesterol, burn calories to help manage her weight, gave you energy, lower stress, and improve his sleep.  The American diabetes Association (ADA) recommends being active for 2-1/2 hours (150 minutes) or more week.  Exercise for 30 minutes, 5 days a week (150 minutes total)    It was a pleasure to see you and I look forward to continuing to work together on your health and well-being. Please do not hesitate to call the office if you need care or have questions about your care.   Have a wonderful day and week. With Gratitude, Alvira Monday MSN, FNP-BC

## 2022-11-22 NOTE — Progress Notes (Signed)
Established Patient Office Visit  Subjective:  Patient ID: James Quinn, male    DOB: 1991-10-26  Age: 32 y.o. MRN: [id number]  CC:  Chief Complaint  Patient presents with   Establish Care    New patient, previously seen by another [facility] facility. Would like to go over his diabetes and hypertension.     HPI James Quinn is a 32 y.o. male with past medical history of type 2 diabetes and hypertension presents for f/u of  chronic medical conditions. For the details of today's visit, please refer to the assessment and plan.     Past Medical History:  Diagnosis Date   Arthritis    lower back   Eczema    Essential hypertension 11/22/2022   Type 2 diabetes mellitus without complications ([place]) 01/21/2024    History reviewed. No pertinent surgical history.  Family History  Problem Relation Age of Onset   Diabetes Sister    Congestive Heart Failure Sister    Kidney failure Sister    Thyroid disease Sister    Arthritis Father    Hypertension Father    Arthritis Maternal Grandmother    Prostate cancer Maternal Grandfather    Cancer Maternal Grandfather        colon cancer   Arthritis Paternal Grandmother    Stroke Mother     Social History   Socioeconomic History   Marital status: Married    Spouse name: Not on file   Number of children: 0   Years of education: 14   Highest education level: Not on file  Occupational History   Occupation: Animal nutritionist  Tobacco Use   Smoking status: Never   Smokeless tobacco: Never  Vaping Use   Vaping Use: Never used  Substance and Sexual Activity   Alcohol use: Yes    Comment: occ   Drug use: No   Sexual activity: Yes    Partners: Female    Birth control/protection: Condom  Other Topics Concern   Not on file  Social History Narrative   Fun: Workout, movies, go out bowling.   Denies religious beliefs effecting healthcare.    Social Determinants of Health   Financial Resource Strain: Not on file   Food Insecurity: Not on file  Transportation Needs: Not on file  Physical Activity: Not on file  Stress: Not on file  Social Connections: Not on file  Intimate Partner Violence: Not on file    Outpatient Medications Prior to Visit  Medication Sig Dispense Refill   aspirin EC 81 MG tablet Take 1 tablet (81 mg total) by mouth daily. 90 tablet 3   atorvastatin (LIPITOR) 80 MG tablet Take 1 tablet (80 mg total) by mouth daily. 90 tablet 3   glucose blood (FREESTYLE TEST STRIPS) test strip Use to check blood sugars up to 4 times a day. 100 each 12   glucose monitoring kit (FREESTYLE) monitoring kit 1 each by Does not apply route as needed for other. Use with test strips and lancets to check BS BID 1 each 1   Lancets (FREESTYLE) lancets Use as instructed 100 each 12   pioglitazone (ACTOS) 30 MG tablet Take 1 tablet (30 mg total) by mouth daily. 90 tablet 3   lisinopril (ZESTRIL) 2.5 MG tablet Take 1 tablet (2.5 mg total) by mouth daily. 90 tablet 3   metFORMIN (GLUCOPHAGE XR) 750 MG 24 hr tablet Take 2 tablets (1,500 mg total) by mouth daily with breakfast. 180 tablet 3   metFORMIN (  GLUCOPHAGE) 500 MG tablet SMARTSIG:1 Tablet(s) By Mouth Morning-Evening     No facility-administered medications prior to visit.    No Known Allergies  ROS Review of Systems  Constitutional:  Negative for fatigue and fever.  Eyes:  Negative for visual disturbance.  Respiratory:  Negative for chest tightness and shortness of breath.   Cardiovascular:  Negative for chest pain and palpitations.  Neurological:  Negative for dizziness and headaches.      Objective:    Physical Exam HENT:     Head: Normocephalic.     Right Ear: External ear normal.     Left Ear: External ear normal.     Nose: No congestion or rhinorrhea.     Mouth/Throat:     Mouth: Mucous membranes are moist.  Cardiovascular:     Rate and Rhythm: Regular rhythm.     Heart sounds: No murmur heard. Pulmonary:     Effort: No  respiratory distress.     Breath sounds: Normal breath sounds.  Neurological:     Mental Status: He is alert.     BP (!) 158/90 (BP Location: Left Arm)   Pulse 80   Ht 6' (1.829 m)   Wt (!) 471 lb 0.6 oz (213.7 kg)   SpO2 97%   BMI 63.88 kg/m  Wt Readings from Last 3 Encounters:  11/22/22 (!) 471 lb 0.6 oz (213.7 kg)  05/21/21 (!) 469 lb 12.8 oz (213.1 kg)  12/24/20 (!) 485 lb 3.2 oz (220.1 kg)    Lab Results  Component Value Date   TSH 3.25 04/13/2015   Lab Results  Component Value Date   WBC 6.3 12/24/2020   HGB 15.7 12/24/2020   HCT 46.6 12/24/2020   MCV 86.1 12/24/2020   PLT 209.0 12/24/2020   Lab Results  Component Value Date   NA 133 (L) 12/24/2020   K 4.5 12/24/2020   CO2 30 12/24/2020   GLUCOSE 259 (H) 12/24/2020   BUN 14 12/24/2020   CREATININE 0.96 12/24/2020   BILITOT 0.5 12/24/2020   ALKPHOS 70 12/24/2020   AST 17 12/24/2020   ALT 29 12/24/2020   PROT 7.6 12/24/2020   ALBUMIN 4.4 12/24/2020   CALCIUM 9.9 12/24/2020   ANIONGAP 14 05/13/2020   GFR 105.30 12/24/2020   Lab Results  Component Value Date   CHOL 262 (H) 12/24/2020   Lab Results  Component Value Date   HDL 38.70 (L) 12/24/2020   Lab Results  Component Value Date   LDLCALC 193 (H) 12/24/2020   Lab Results  Component Value Date   TRIG 152.0 (H) 12/24/2020   Lab Results  Component Value Date   CHOLHDL 7 12/24/2020   Lab Results  Component Value Date   HGBA1C 10.3 (A) 05/21/2021      Assessment & Plan:  Essential hypertension Assessment & Plan: Uncontrolled He reports taking lisinopril 2.5 mg however he has not been on treatment regimen for about a year He reports occasional headaches, visual disturbances We will start patient on olmesartan 20 mg daily Encourage low-sodium diet with increased physical activities BP Readings from Last 3 Encounters:  11/22/22 (!) 158/90  05/21/21 138/88  12/24/20 130/84     Orders: -     Olmesartan Medoxomil; Take 1 tablet (20  mg total) by mouth daily.  Dispense: 30 tablet; Refill: 2  Type 2 diabetes mellitus without complication, without long-term current use of insulin ([place]) Assessment & Plan: He reports taking metformin 500 mg daily He denies polyuria, polyphagia, polydipsia  Will assess hemoglobin A1c today and make adjustments to his medications as needed Patient is aware of plan of care and verbalized understanding   Need for immunization against influenza Assessment & Plan: Patient educated on CDC recommendation for the vaccine. Verbal consent was obtained from the patient, vaccine administered by nurse, no sign of adverse reactions noted at this time. Patient education on arm soreness and use of tylenol or ibuprofen for this patient  was discussed. Patient educated on the signs and symptoms of adverse effect and advise to contact the office if they occur.    Flu vaccine need -     Flu Vaccine QUAD 51moIM (Fluarix, Fluzone & Alfiuria Quad PF)  Newly diagnosed diabetes ([place] -     Microalbumin / creatinine urine ratio -     metFORMIN HCl; Take 1 tablet (500 mg total) by mouth daily with breakfast.  Dispense: 30 tablet; Refill: 2 -     Hemoglobin A1c  Vitamin D deficiency -     VITAMIN D 25 Hydroxy (Vit-D Deficiency, Fractures)  Need for hepatitis C screening test -     Hepatitis C antibody  Encounter for screening for HIV -     HIV Antibody (routine testing w rflx)  Other specified hypothyroidism -     TSH + free T4  Other hyperlipidemia -     Lipid panel -     CMP14+EGFR -     CBC with Differential/Platelet    Follow-up: Return in about 1 month (around 12/23/2022) for BP.   GAlvira Monday FNP

## 2022-11-26 ENCOUNTER — Encounter: Payer: Self-pay | Admitting: Family Medicine

## 2022-11-29 ENCOUNTER — Other Ambulatory Visit: Payer: Self-pay | Admitting: Family Medicine

## 2022-11-29 DIAGNOSIS — E119 Type 2 diabetes mellitus without complications: Secondary | ICD-10-CM

## 2022-11-29 DIAGNOSIS — E559 Vitamin D deficiency, unspecified: Secondary | ICD-10-CM

## 2022-11-29 LAB — HEMOGLOBIN A1C
Est. average glucose Bld gHb Est-mCnc: 240 mg/dL
Hgb A1c MFr Bld: 10 % — ABNORMAL HIGH (ref 4.8–5.6)

## 2022-11-29 LAB — CBC WITH DIFFERENTIAL/PLATELET
Basophils Absolute: 0 10*3/uL (ref 0.0–0.2)
Basos: 1 %
EOS (ABSOLUTE): 0.1 10*3/uL (ref 0.0–0.4)
Eos: 3 %
Hematocrit: 48.2 % (ref 37.5–51.0)
Hemoglobin: 15.9 g/dL (ref 13.0–17.7)
Immature Grans (Abs): 0 10*3/uL (ref 0.0–0.1)
Immature Granulocytes: 1 %
Lymphocytes Absolute: 1.6 10*3/uL (ref 0.7–3.1)
Lymphs: 39 %
MCH: 28.8 pg (ref 26.6–33.0)
MCHC: 33 g/dL (ref 31.5–35.7)
MCV: 87 fL (ref 79–97)
Monocytes Absolute: 0.8 10*3/uL (ref 0.1–0.9)
Monocytes: 18 %
Neutrophils Absolute: 1.7 10*3/uL (ref 1.4–7.0)
Neutrophils: 38 %
Platelets: 219 10*3/uL (ref 150–450)
RBC: 5.52 x10E6/uL (ref 4.14–5.80)
RDW: 12 % (ref 11.6–15.4)
WBC: 4.2 10*3/uL (ref 3.4–10.8)

## 2022-11-29 LAB — LIPID PANEL
Chol/HDL Ratio: 6.7 ratio — ABNORMAL HIGH (ref 0.0–5.0)
Cholesterol, Total: 215 mg/dL — ABNORMAL HIGH (ref 100–199)
HDL: 32 mg/dL — ABNORMAL LOW (ref >39)
LDL Chol Calc (NIH): 163 mg/dL — ABNORMAL HIGH (ref 0–99)
Triglycerides: 111 mg/dL (ref 0–149)
VLDL Cholesterol Cal: 20 mg/dL (ref 5–40)

## 2022-11-29 LAB — CMP14+EGFR
ALT: 38 IU/L (ref 0–44)
AST: 24 IU/L (ref 0–40)
Albumin/Globulin Ratio: 1.3 (ref 1.2–2.2)
Albumin: 4.3 g/dL (ref 4.1–5.1)
Alkaline Phosphatase: 72 IU/L (ref 44–121)
BUN/Creatinine Ratio: 11 (ref 9–20)
BUN: 10 mg/dL (ref 6–20)
Bilirubin Total: 0.3 mg/dL (ref 0.0–1.2)
CO2: 22 mmol/L (ref 20–29)
Calcium: 9.5 mg/dL (ref 8.7–10.2)
Chloride: 98 mmol/L (ref 96–106)
Creatinine, Ser: 0.95 mg/dL (ref 0.76–1.27)
Globulin, Total: 3.2 g/dL (ref 1.5–4.5)
Glucose: 189 mg/dL — ABNORMAL HIGH (ref 70–99)
Potassium: 4.3 mmol/L (ref 3.5–5.2)
Sodium: 134 mmol/L (ref 134–144)
Total Protein: 7.5 g/dL (ref 6.0–8.5)
eGFR: 108 mL/min/{1.73_m2} (ref >59)

## 2022-11-29 LAB — TSH+FREE T4
Free T4: 1.1 ng/dL (ref 0.82–1.77)
TSH: 4.16 u[IU]/mL (ref 0.450–4.500)

## 2022-11-29 LAB — VITAMIN D 25 HYDROXY (VIT D DEFICIENCY, FRACTURES): Vit D, 25-Hydroxy: 17 ng/mL — ABNORMAL LOW (ref 30.0–100.0)

## 2022-11-29 LAB — HEPATITIS C ANTIBODY: Hep C Virus Ab: NONREACTIVE

## 2022-11-29 LAB — HIV ANTIBODY (ROUTINE TESTING W REFLEX): HIV Screen 4th Generation wRfx: NONREACTIVE

## 2022-11-29 MED ORDER — VITAMIN D (ERGOCALCIFEROL) 1.25 MG (50000 UNIT) PO CAPS: 50000.0000 [IU] | ORAL_CAPSULE | ORAL | 1 refills | Status: DC

## 2022-11-29 MED ORDER — METFORMIN HCL 1000 MG PO TABS: 1000.0000 mg | ORAL_TABLET | Freq: Two times a day (BID) | ORAL | 3 refills | Status: DC

## 2022-11-29 MED ORDER — PIOGLITAZONE HCL 45 MG PO TABS: 45.0000 mg | ORAL_TABLET | Freq: Every day | ORAL | 3 refills | Status: DC

## 2022-11-30 LAB — MICROALBUMIN / CREATININE URINE RATIO
Creatinine, Urine: 205.6 mg/dL
Microalb/Creat Ratio: 66 mg/g creat — ABNORMAL HIGH (ref 0–29)
Microalbumin, Urine: 135.8 ug/mL

## 2022-12-27 ENCOUNTER — Ambulatory Visit (INDEPENDENT_AMBULATORY_CARE_PROVIDER_SITE_OTHER): Payer: No Typology Code available for payment source | Admitting: Family Medicine

## 2022-12-27 ENCOUNTER — Encounter: Payer: Self-pay | Admitting: Family Medicine

## 2022-12-27 VITALS — BP 124/74 | HR 86 | Ht 72.0 in | Wt >= 6400 oz

## 2022-12-27 DIAGNOSIS — E559 Vitamin D deficiency, unspecified: Secondary | ICD-10-CM

## 2022-12-27 DIAGNOSIS — E119 Type 2 diabetes mellitus without complications: Secondary | ICD-10-CM

## 2022-12-27 DIAGNOSIS — I10 Essential (primary) hypertension: Secondary | ICD-10-CM

## 2022-12-27 MED ORDER — VITAMIN D (ERGOCALCIFEROL) 1.25 MG (50000 UNIT) PO CAPS: 50000.0000 [IU] | ORAL_CAPSULE | ORAL | 1 refills | Status: DC

## 2022-12-27 MED ORDER — DEXCOM G7 RECEIVER DEVI: Status: DC

## 2022-12-27 MED ORDER — DEXCOM G7 SENSOR MISC: Status: DC

## 2022-12-27 NOTE — Patient Instructions (Addendum)
I appreciate the opportunity to provide care to you today!    Follow up:  3 months  Labs:next visit  Please continue to a heart-healthy diet and increase your physical activities. Try to exercise for 15mins at least five times a week.   Please call and schedule your annual eye exam  Physical activity helps: Lower your blood glucose, improve your heart health, lower your blood pressure and cholesterol, burn calories to help manage her weight, gave you energy, lower stress, and improve his sleep.  The American diabetes Association (ADA) recommends being active for 2-1/2 hours (150 minutes) or more week.  Exercise for 30 minutes, 5 days a week (150 minutes total)    It was a pleasure to see you and I look forward to continuing to work together on your health and well-being. Please do not hesitate to call the office if you need care or have questions about your care.   Have a wonderful day and week. With Gratitude, Alvira Monday MSN, FNP-BC

## 2022-12-27 NOTE — Assessment & Plan Note (Signed)
Controlled He takes olmesartan 20 mg daily Denies headaches, dizziness, blurred vision, chest pain, palpitation, shortness of breath Reports compliance with treatment regimen Encouraged low-sodium diet with increased physical activity BP Readings from Last 3 Encounters:  12/27/22 124/74  11/22/22 (!) 158/90  05/21/21 138/88

## 2022-12-27 NOTE — Progress Notes (Signed)
Established Patient Office Visit  Subjective:  Patient ID: EDD REPPERT, male    DOB: [birth year]  Age: 32 y.o. MRN: [id number]  CC:  Chief Complaint  Patient presents with   Follow-up    1 month f/u for htn. Pt would like to have a dexcom ordered.     HPI James Quinn is a 32 y.o. male with past medical history of hypertension presents for f/u of  chronic medical conditions. For the details of today's visit, please refer to the assessment and plan.     Past Medical History:  Diagnosis Date   Arthritis    lower back   Eczema    Essential hypertension 11/22/2022   Type 2 diabetes mellitus without complications ([place]) 04/23/6947    History reviewed. No pertinent surgical history.  Family History  Problem Relation Age of Onset   Diabetes Sister    Congestive Heart Failure Sister    Kidney failure Sister    Thyroid disease Sister    Arthritis Father    Hypertension Father    Arthritis Maternal Grandmother    Prostate cancer Maternal Grandfather    Cancer Maternal Grandfather        colon cancer   Arthritis Paternal Grandmother    Stroke Mother     Social History   Socioeconomic History   Marital status: Married    Spouse name: Not on file   Number of children: 0   Years of education: 14   Highest education level: Not on file  Occupational History   Occupation: Animal nutritionist  Tobacco Use   Smoking status: Never   Smokeless tobacco: Never  Vaping Use   Vaping Use: Never used  Substance and Sexual Activity   Alcohol use: Yes    Comment: occ   Drug use: No   Sexual activity: Yes    Partners: Female    Birth control/protection: Condom  Other Topics Concern   Not on file  Social History Narrative   Fun: Workout, movies, go out bowling.   Denies religious beliefs effecting healthcare.    Social Determinants of Health   Financial Resource Strain: Not on file  Food Insecurity: Not on file  Transportation Needs: Not on file  Physical  Activity: Not on file  Stress: Not on file  Social Connections: Not on file  Intimate Partner Violence: Not on file    Outpatient Medications Prior to Visit  Medication Sig Dispense Refill   aspirin EC 81 MG tablet Take 1 tablet (81 mg total) by mouth daily. 90 tablet 3   atorvastatin (LIPITOR) 80 MG tablet Take 1 tablet (80 mg total) by mouth daily. 90 tablet 3   glucose blood (FREESTYLE TEST STRIPS) test strip Use to check blood sugars up to 4 times a day. 100 each 12   glucose monitoring kit (FREESTYLE) monitoring kit 1 each by Does not apply route as needed for other. Use with test strips and lancets to check BS BID 1 each 1   Lancets (FREESTYLE) lancets Use as instructed 100 each 12   metFORMIN (GLUCOPHAGE) 1000 MG tablet Take 1 tablet (1,000 mg total) by mouth 2 (two) times daily with a meal. 180 tablet 3   olmesartan (BENICAR) 20 MG tablet Take 1 tablet (20 mg total) by mouth daily. 30 tablet 2   pioglitazone (ACTOS) 45 MG tablet Take 1 tablet (45 mg total) by mouth daily. 30 tablet 3   Vitamin D, Ergocalciferol, (DRISDOL) 1.25 MG (50000 UNIT) CAPS capsule  Take 1 capsule (50,000 Units total) by mouth every 7 (seven) days. 10 capsule 1   No facility-administered medications prior to visit.    No Known Allergies  ROS Review of Systems  Constitutional:  Negative for fatigue and fever.  Eyes:  Negative for visual disturbance.  Respiratory:  Negative for chest tightness and shortness of breath.   Cardiovascular:  Negative for chest pain and palpitations.  Neurological:  Negative for dizziness and headaches.      Objective:    Physical Exam HENT:     Head: Normocephalic.     Right Ear: External ear normal.     Left Ear: External ear normal.     Nose: No congestion or rhinorrhea.     Mouth/Throat:     Mouth: Mucous membranes are moist.  Cardiovascular:     Rate and Rhythm: Regular rhythm.     Heart sounds: No murmur heard. Pulmonary:     Effort: No respiratory distress.      Breath sounds: Normal breath sounds.  Neurological:     Mental Status: He is alert.     BP 124/74   Pulse 86   Ht 6' (1.829 m)   Wt (!) 471 lb (213.6 kg)   SpO2 98%   BMI 63.88 kg/m  Wt Readings from Last 3 Encounters:  12/27/22 (!) 471 lb (213.6 kg)  11/22/22 (!) 471 lb 0.6 oz (213.7 kg)  05/21/21 (!) 469 lb 12.8 oz (213.1 kg)    Lab Results  Component Value Date   TSH 4.160 11/28/2022   Lab Results  Component Value Date   WBC 4.2 11/28/2022   HGB 15.9 11/28/2022   HCT 48.2 11/28/2022   MCV 87 11/28/2022   PLT 219 11/28/2022   Lab Results  Component Value Date   NA 134 11/28/2022   K 4.3 11/28/2022   CO2 22 11/28/2022   GLUCOSE 189 (H) 11/28/2022   BUN 10 11/28/2022   CREATININE 0.95 11/28/2022   BILITOT 0.3 11/28/2022   ALKPHOS 72 11/28/2022   AST 24 11/28/2022   ALT 38 11/28/2022   PROT 7.5 11/28/2022   ALBUMIN 4.3 11/28/2022   CALCIUM 9.5 11/28/2022   ANIONGAP 14 05/13/2020   EGFR 108 11/28/2022   GFR 105.30 12/24/2020   Lab Results  Component Value Date   CHOL 215 (H) 11/28/2022   Lab Results  Component Value Date   HDL 32 (L) 11/28/2022   Lab Results  Component Value Date   LDLCALC 163 (H) 11/28/2022   Lab Results  Component Value Date   TRIG 111 11/28/2022   Lab Results  Component Value Date   CHOLHDL 6.7 (H) 11/28/2022   Lab Results  Component Value Date   HGBA1C 10.0 (H) 11/28/2022      Assessment & Plan:  Essential hypertension Assessment & Plan: Controlled He takes olmesartan 20 mg daily Denies headaches, dizziness, blurred vision, chest pain, palpitation, shortness of breath Reports compliance with treatment regimen Encouraged low-sodium diet with increased physical activity BP Readings from Last 3 Encounters:  12/27/22 124/74  11/22/22 (!) 158/90  05/21/21 138/88      Vitamin D deficiency -     Vitamin D (Ergocalciferol); Take 1 capsule (50,000 Units total) by mouth every 7 (seven) days.  Dispense: 10  capsule; Refill: 1  Type 2 diabetes mellitus without complication, without long-term current use of insulin ([place]) -     HM Diabetes Foot Exam -     Dexcom G7 Sensor; Follow the instructions  on the label  Dispense: 1 each; Refill: [place] [facility]; Follow the instructions on the label  Dispense: 1 each; Refill: EACH    Follow-up: Return in about 3 months (around 03/27/2023).   Alvira Monday, FNP

## 2023-01-06 ENCOUNTER — Encounter: Payer: Self-pay | Admitting: Family Medicine

## 2023-01-09 ENCOUNTER — Other Ambulatory Visit: Payer: Self-pay

## 2023-01-09 DIAGNOSIS — E119 Type 2 diabetes mellitus without complications: Secondary | ICD-10-CM

## 2023-01-09 DIAGNOSIS — E559 Vitamin D deficiency, unspecified: Secondary | ICD-10-CM

## 2023-01-09 DIAGNOSIS — I10 Essential (primary) hypertension: Secondary | ICD-10-CM

## 2023-01-09 MED ORDER — PIOGLITAZONE HCL 45 MG PO TABS: 45.0000 mg | ORAL_TABLET | Freq: Every day | ORAL | 3 refills | Status: DC

## 2023-01-09 MED ORDER — VITAMIN D (ERGOCALCIFEROL) 1.25 MG (50000 UNIT) PO CAPS: 50000.0000 [IU] | ORAL_CAPSULE | ORAL | 1 refills | Status: DC

## 2023-01-09 MED ORDER — OLMESARTAN MEDOXOMIL 20 MG PO TABS: 20.0000 mg | ORAL_TABLET | Freq: Every day | ORAL | 2 refills | Status: DC

## 2023-02-05 ENCOUNTER — Other Ambulatory Visit: Payer: Self-pay | Admitting: Family Medicine

## 2023-02-05 DIAGNOSIS — E119 Type 2 diabetes mellitus without complications: Secondary | ICD-10-CM

## 2023-02-05 DIAGNOSIS — I10 Essential (primary) hypertension: Secondary | ICD-10-CM

## 2023-03-27 ENCOUNTER — Ambulatory Visit: Payer: No Typology Code available for payment source | Admitting: Family Medicine

## 2023-04-19 ENCOUNTER — Ambulatory Visit: Payer: No Typology Code available for payment source

## 2023-04-21 ENCOUNTER — Ambulatory Visit: Payer: No Typology Code available for payment source | Admitting: Family Medicine

## 2023-06-06 ENCOUNTER — Ambulatory Visit: Payer: No Typology Code available for payment source

## 2023-06-09 ENCOUNTER — Ambulatory Visit: Payer: No Typology Code available for payment source | Admitting: Family Medicine

## 2023-07-05 ENCOUNTER — Ambulatory Visit: Payer: No Typology Code available for payment source | Admitting: Family Medicine

## 2023-08-07 ENCOUNTER — Ambulatory Visit: Payer: No Typology Code available for payment source | Admitting: Family Medicine

## 2023-10-06 ENCOUNTER — Ambulatory Visit (INDEPENDENT_AMBULATORY_CARE_PROVIDER_SITE_OTHER): Payer: Commercial Managed Care - PPO | Admitting: Family Medicine

## 2023-10-06 ENCOUNTER — Encounter: Payer: Self-pay | Admitting: Family Medicine

## 2023-10-06 VITALS — BP 140/84 | HR 83 | Ht 72.0 in | Wt >= 6400 oz

## 2023-10-06 DIAGNOSIS — Z23 Encounter for immunization: Secondary | ICD-10-CM | POA: Insufficient documentation

## 2023-10-06 DIAGNOSIS — E1169 Type 2 diabetes mellitus with other specified complication: Secondary | ICD-10-CM | POA: Diagnosis not present

## 2023-10-06 DIAGNOSIS — E559 Vitamin D deficiency, unspecified: Secondary | ICD-10-CM

## 2023-10-06 DIAGNOSIS — Z7984 Long term (current) use of oral hypoglycemic drugs: Secondary | ICD-10-CM

## 2023-10-06 DIAGNOSIS — E119 Type 2 diabetes mellitus without complications: Secondary | ICD-10-CM

## 2023-10-06 DIAGNOSIS — E785 Hyperlipidemia, unspecified: Secondary | ICD-10-CM

## 2023-10-06 DIAGNOSIS — I10 Essential (primary) hypertension: Secondary | ICD-10-CM

## 2023-10-06 DIAGNOSIS — R7301 Impaired fasting glucose: Secondary | ICD-10-CM

## 2023-10-06 DIAGNOSIS — E038 Other specified hypothyroidism: Secondary | ICD-10-CM

## 2023-10-06 MED ORDER — VITAMIN D (ERGOCALCIFEROL) 1.25 MG (50000 UNIT) PO CAPS: 50000.0000 [IU] | ORAL_CAPSULE | ORAL | 1 refills | Status: DC

## 2023-10-06 MED ORDER — ATORVASTATIN CALCIUM 80 MG PO TABS: 80.0000 mg | ORAL_TABLET | Freq: Every day | ORAL | 3 refills | Status: DC

## 2023-10-06 MED ORDER — OLMESARTAN MEDOXOMIL 20 MG PO TABS: 20.0000 mg | ORAL_TABLET | Freq: Every day | ORAL | 1 refills | Status: DC

## 2023-10-06 MED ORDER — METFORMIN HCL 1000 MG PO TABS: 1000.0000 mg | ORAL_TABLET | Freq: Two times a day (BID) | ORAL | 3 refills | Status: DC

## 2023-10-06 MED ORDER — FREESTYLE LIBRE 3 SENSOR MISC: 2 refills | Status: DC

## 2023-10-06 NOTE — Patient Instructions (Signed)
I appreciate the opportunity to provide care to you today!    Follow up:  1 month for BP  Labs: please stop by the lab today to get your blood drawn (CBC, CMP, TSH, Lipid profile, HgA1c, Vit D) Hypertension Management  Your current blood pressure is above the target goal of <140/90 mmHg. To address this, please continue taking olmesartan 20 mg daily and   Medication Instructions: Take your blood pressure medication at the same time each day. After taking your medication, check your blood pressure at least an hour later. If your first reading is >140/90 mmHg, wait at least 10 minutes and recheck your blood pressure. Side Effects: In the initial days of therapy, you may experience dizziness or lightheadedness as your body adjusts to the lower blood pressure; this is expected. Diet and Lifestyle: Adhere to a low-sodium diet, limiting intake to less than 1500 mg daily, and increase your physical activity. Avoid over-the-counter NSAIDs such as ibuprofen and naproxen while on this medication. Hydration and Nutrition: Stay well-hydrated by drinking at least 64 ounces of water daily. Increase your servings of fruits and vegetables and avoid excessive sodium in your diet. Long-Term Considerations: Uncontrolled hypertension can increase the risk of cardiovascular diseases, including stroke, coronary artery disease, and heart failure.  Please report to the emergency department if your blood pressure exceeds 180/120 and is accompanied by symptoms such as headaches, chest pain, palpitations, blurred vision, or dizziness.   Goal blood sugars:  Fasting blood sugars: 80-130 Before lunch and dinner: less than 140 2 hours after meals: less than 180     Attached with your AVS, you will find valuable resources for self-education. I highly recommend dedicating some time to thoroughly examine them.   Please continue to a heart-healthy diet and increase your physical activities. Try to exercise for at  least five days a week.    It was a pleasure to see you and I look forward to continuing to work together on your health and well-being. Please do not hesitate to call the office if you need care or have questions about your care.  In case of emergency, please visit the Emergency Department for urgent care, or contact our clinic at [phone] to schedule an appointment. We're here to help you!   Have a wonderful day and week. With Gratitude, Gilmore Laroche MSN, FNP-BC

## 2023-10-06 NOTE — Assessment & Plan Note (Signed)
The patient reports that he has been out of atorvastatin 80 mg for the past 2 to 3 months. We will reinstate atorvastatin therapy today. He was advised to decrease intake of greasy, fatty, and starchy foods and to increase physical activity. A lipid panel is pending.

## 2023-10-06 NOTE — Progress Notes (Signed)
Established Patient Office Visit  Subjective:  Patient ID: James Quinn, male    DOB: Jul 12, 1991  Age: 32 y.o. MRN: [id number]  CC:  Chief Complaint  Patient presents with   Care Management    Would like to get an rx for freestyle libre for diabetic control. Would like to discuss weight loss options.     HPI James Quinn is a 32 y.o. male with past medical history of type 2 diabetes, hyperlipidemia, and hypertension presents for f/u of  chronic medical conditions. For the details of today's visit, please refer to the assessment and plan.     Past Medical History:  Diagnosis Date   Arthritis    lower back   Eczema    Essential hypertension 11/22/2022   Type 2 diabetes mellitus without complications (HCC) 11/22/2022    History reviewed. No pertinent surgical history.  Family History  Problem Relation Age of Onset   Diabetes Sister    Congestive Heart Failure Sister    Kidney failure Sister    Thyroid disease Sister    Arthritis Father    Hypertension Father    Arthritis Maternal Grandmother    Prostate cancer Maternal Grandfather    Cancer Maternal Grandfather        colon cancer   Arthritis Paternal Grandmother    Stroke Mother     Social History   Socioeconomic History   Marital status: Married    Spouse name: Not on file   Number of children: 0   Years of education: 14   Highest education level: Associate degree: academic program  Occupational History   Occupation: Engineer, materials  Tobacco Use   Smoking status: Never   Smokeless tobacco: Never  Vaping Use   Vaping status: Never Used  Substance and Sexual Activity   Alcohol use: Yes    Comment: occ   Drug use: No   Sexual activity: Yes    Partners: Female    Birth control/protection: Condom  Other Topics Concern   Not on file  Social History Narrative   Fun: Workout, movies, go out bowling.   Denies religious beliefs effecting healthcare.    Social Determinants of Health    Financial Resource Strain: Low Risk  (10/02/2023)   Overall Financial Resource Strain (CARDIA)    Difficulty of Paying Living Expenses: Not very hard  Food Insecurity: Food Insecurity Present (10/02/2023)   Hunger Vital Sign    Worried About Running Out of Food in the Last Year: Sometimes true    Ran Out of Food in the Last Year: Patient declined  Transportation Needs: No Transportation Needs (10/02/2023)   PRAPARE - Administrator, Civil Service (Medical): No    Lack of Transportation (Non-Medical): No  Physical Activity: Insufficiently Active (10/02/2023)   Exercise Vital Sign    Days of Exercise per Week: 2 days    Minutes of Exercise per Session: 20 min  Stress: Stress Concern Present (10/02/2023)   [employer] of Occupational Health - Occupational Stress Questionnaire    Feeling of Stress : Rather much  Social Connections: Moderately Isolated (10/02/2023)   Social Connection and Isolation Panel [NHANES]    Frequency of Communication with Friends and Family: Once a week    Frequency of Social Gatherings with Friends and Family: Once a week    Attends Religious Services: More than 4 times per year    Active Member of [employer] or Organizations: No    Attends Banker Meetings:  Not on file    Marital Status: Married  Catering manager Violence: Not on file    Outpatient Medications Prior to Visit  Medication Sig Dispense Refill   aspirin EC 81 MG tablet Take 1 tablet (81 mg total) by mouth daily. 90 tablet 3   glucose blood (FREESTYLE TEST STRIPS) test strip Use to check blood sugars up to 4 times a day. 100 each 12   glucose monitoring kit (FREESTYLE) monitoring kit 1 each by Does not apply route as needed for other. Use with test strips and lancets to check BS BID 1 each 1   Lancets (FREESTYLE) lancets Use as instructed 100 each 12   Vitamin D, Ergocalciferol, (DRISDOL) 1.25 MG (50000 UNIT) CAPS capsule Take 1 capsule (50,000 Units total) by mouth  every 7 (seven) days. 10 capsule 1   atorvastatin (LIPITOR) 80 MG tablet Take 1 tablet (80 mg total) by mouth daily. 90 tablet 3   Continuous Blood Gluc Receiver (DEXCOM G7 RECEIVER) DEVI Follow the instructions on the label 1 each EACH   Continuous Blood Gluc Sensor (DEXCOM G7 SENSOR) MISC Follow the instructions on the label 1 each EACH   metFORMIN (GLUCOPHAGE) 1000 MG tablet Take 1 tablet (1,000 mg total) by mouth 2 (two) times daily with a meal. (Patient not taking: Reported on 10/06/2023) 180 tablet 3   olmesartan (BENICAR) 20 MG tablet TAKE 1 TABLET BY MOUTH EVERY DAY (Patient not taking: Reported on 10/06/2023) 90 tablet 0   pioglitazone (ACTOS) 45 MG tablet TAKE 1 TABLET BY MOUTH EVERY DAY (Patient not taking: Reported on 10/06/2023) 90 tablet 1   No facility-administered medications prior to visit.    No Known Allergies  ROS Review of Systems  Constitutional:  Negative for fatigue and fever.  Eyes:  Negative for visual disturbance.  Respiratory:  Negative for chest tightness and shortness of breath.   Cardiovascular:  Negative for chest pain and palpitations.  Neurological:  Negative for dizziness and headaches.      Objective:    Physical Exam HENT:     Head: Normocephalic.     Right Ear: External ear normal.     Left Ear: External ear normal.     Nose: No congestion or rhinorrhea.     Mouth/Throat:     Mouth: Mucous membranes are moist.  Cardiovascular:     Rate and Rhythm: Regular rhythm.     Heart sounds: No murmur heard. Pulmonary:     Effort: No respiratory distress.     Breath sounds: Normal breath sounds.  Neurological:     Mental Status: He is alert.     BP (!) 140/84 (BP Location: Left Arm)   Pulse 83   Ht 6' (1.829 m)   Wt (!) 471 lb 1.3 oz (213.7 kg)   SpO2 97%   BMI 63.89 kg/m  Wt Readings from Last 3 Encounters:  10/06/23 (!) 471 lb 1.3 oz (213.7 kg)  12/27/22 (!) 471 lb (213.6 kg)  11/22/22 (!) 471 lb 0.6 oz (213.7 kg)    Lab Results   Component Value Date   TSH 4.160 11/28/2022   Lab Results  Component Value Date   WBC 4.2 11/28/2022   HGB 15.9 11/28/2022   HCT 48.2 11/28/2022   MCV 87 11/28/2022   PLT 219 11/28/2022   Lab Results  Component Value Date   NA 134 11/28/2022   K 4.3 11/28/2022   CO2 22 11/28/2022   GLUCOSE 189 (H) 11/28/2022   BUN 10  11/28/2022   CREATININE 0.95 11/28/2022   BILITOT 0.3 11/28/2022   ALKPHOS 72 11/28/2022   AST 24 11/28/2022   ALT 38 11/28/2022   PROT 7.5 11/28/2022   ALBUMIN 4.3 11/28/2022   CALCIUM 9.5 11/28/2022   ANIONGAP 14 05/13/2020   EGFR 108 11/28/2022   GFR 105.30 12/24/2020   Lab Results  Component Value Date   CHOL 215 (H) 11/28/2022   Lab Results  Component Value Date   HDL 32 (L) 11/28/2022   Lab Results  Component Value Date   LDLCALC 163 (H) 11/28/2022   Lab Results  Component Value Date   TRIG 111 11/28/2022   Lab Results  Component Value Date   CHOLHDL 6.7 (H) 11/28/2022   Lab Results  Component Value Date   HGBA1C 10.0 (H) 11/28/2022      Assessment & Plan:  Essential hypertension Assessment & Plan: The patient is uncontrolled today in the clinic. He reports being out of his blood pressure medication for the past month but is asymptomatic. We will reinstate therapy with olmesartan 20 mg daily. A low-sodium diet and increased physical activity are recommended to help manage blood pressure. We will follow up on his blood pressure in one month.  BP Readings from Last 3 Encounters:  10/06/23 (!) 140/84  12/27/22 124/74  11/22/22 (!) 158/90     Orders: -     Olmesartan Medoxomil; Take 1 tablet (20 mg total) by mouth daily.  Dispense: 90 tablet; Refill: 1  Type 2 diabetes mellitus without complication, without long-term current use of insulin (HCC) Assessment & Plan: The patient reports that he ran out of metformin a few weeks ago. He normally takes metformin 1000 mg twice daily. He denies polydipsia and polyphagia but  reports polyuria. Recommendations include decreasing intake of high-sugar foods and beverages and increasing physical activity. We will reinstate metformin therapy and assess his hemoglobin A1c. Medication adjustments will be made as indicated based on the results.    Orders: -     FreeStyle Libre 3 Sensor; Place 1 sensor on the skin every 14 days. Use to check glucose continuously  Dispense: 6 each; Refill: 2 -     metFORMIN HCl; Take 1 tablet (1,000 mg total) by mouth 2 (two) times daily with a meal.  Dispense: 180 tablet; Refill: 3  Hyperlipidemia associated with type 2 diabetes mellitus (HCC) Assessment & Plan: The patient reports that he has been out of atorvastatin 80 mg for the past 2 to 3 months. We will reinstate atorvastatin therapy today. He was advised to decrease intake of greasy, fatty, and starchy foods and to increase physical activity. A lipid panel is pending.    Orders: -     Lipid panel -     CMP14+EGFR -     CBC with Differential/Platelet -     Atorvastatin Calcium; Take 1 tablet (80 mg total) by mouth daily.  Dispense: 90 tablet; Refill: 3  Encounter for immunization Assessment & Plan: Patient educated on CDC recommendation for the vaccine. Verbal consent was obtained from the patient, vaccine administered by nurse, no sign of adverse reactions noted at this time. Patient education on arm soreness and use of tylenol or ibuprofen for this patient  was discussed. Patient educated on the signs and symptoms of adverse effect and advise to contact the office if they occur.   Orders: -     Flu vaccine trivalent PF, 6mos and older(Flulaval,Afluria,Fluarix,Fluzone)  IFG (impaired fasting glucose) -  Hemoglobin A1c  Vitamin D deficiency -     VITAMIN D 25 Hydroxy (Vit-D Deficiency, Fractures) -     Vitamin D (Ergocalciferol); Take 1 capsule (50,000 Units total) by mouth every 7 (seven) days.  Dispense: 20 capsule; Refill: 1  TSH (thyroid-stimulating hormone  deficiency) -     TSH + free T4  Note: This chart has been completed using Engineer, civil (consulting) software, and while attempts have been made to ensure accuracy, certain words and phrases may not be transcribed as intended.    Follow-up: Return in about 1 month (around 11/05/2023) for BP.   Gilmore Laroche, FNP

## 2023-10-06 NOTE — Assessment & Plan Note (Signed)
The patient reports that he ran out of metformin a few weeks ago. He normally takes metformin 1000 mg twice daily. He denies polydipsia and polyphagia but reports polyuria. Recommendations include decreasing intake of high-sugar foods and beverages and increasing physical activity. We will reinstate metformin therapy and assess his hemoglobin A1c. Medication adjustments will be made as indicated based on the results.

## 2023-10-06 NOTE — Assessment & Plan Note (Signed)
Patient educated on CDC recommendation for the vaccine. Verbal consent was obtained from the patient, vaccine administered by nurse, no sign of adverse reactions noted at this time. Patient education on arm soreness and use of tylenol or ibuprofen for this patient  was discussed. Patient educated on the signs and symptoms of adverse effect and advise to contact the office if they occur.  

## 2023-10-06 NOTE — Assessment & Plan Note (Signed)
The patient is uncontrolled today in the clinic. He reports being out of his blood pressure medication for the past month but is asymptomatic. We will reinstate therapy with olmesartan 20 mg daily. A low-sodium diet and increased physical activity are recommended to help manage blood pressure. We will follow up on his blood pressure in one month.  BP Readings from Last 3 Encounters:  10/06/23 (!) 140/84  12/27/22 124/74  11/22/22 (!) 158/90

## 2023-10-07 LAB — CBC WITH DIFFERENTIAL/PLATELET
Basophils Absolute: 0 10*3/uL (ref 0.0–0.2)
Basos: 1 %
EOS (ABSOLUTE): 0.3 10*3/uL (ref 0.0–0.4)
Eos: 4 %
Hematocrit: 50.1 % (ref 37.5–51.0)
Hemoglobin: 16.6 g/dL (ref 13.0–17.7)
Immature Grans (Abs): 0 10*3/uL (ref 0.0–0.1)
Immature Granulocytes: 1 %
Lymphocytes Absolute: 2.3 10*3/uL (ref 0.7–3.1)
Lymphs: 35 %
MCH: 29 pg (ref 26.6–33.0)
MCHC: 33.1 g/dL (ref 31.5–35.7)
MCV: 87 fL (ref 79–97)
Monocytes Absolute: 0.5 10*3/uL (ref 0.1–0.9)
Monocytes: 7 %
Neutrophils Absolute: 3.5 10*3/uL (ref 1.4–7.0)
Neutrophils: 52 %
Platelets: 257 10*3/uL (ref 150–450)
RBC: 5.73 x10E6/uL (ref 4.14–5.80)
RDW: 12.1 % (ref 11.6–15.4)
WBC: 6.6 10*3/uL (ref 3.4–10.8)

## 2023-10-07 LAB — LIPID PANEL
Chol/HDL Ratio: 6.6 ratio — ABNORMAL HIGH (ref 0.0–5.0)
Cholesterol, Total: 263 mg/dL — ABNORMAL HIGH (ref 100–199)
HDL: 40 mg/dL (ref >39)
LDL Chol Calc (NIH): 196 mg/dL — ABNORMAL HIGH (ref 0–99)
Triglycerides: 146 mg/dL (ref 0–149)
VLDL Cholesterol Cal: 27 mg/dL (ref 5–40)

## 2023-10-07 LAB — CMP14+EGFR
ALT: 32 [IU]/L (ref 0–44)
AST: 21 [IU]/L (ref 0–40)
Albumin: 4.4 g/dL (ref 4.1–5.1)
Alkaline Phosphatase: 81 [IU]/L (ref 44–121)
BUN/Creatinine Ratio: 10 (ref 9–20)
BUN: 10 mg/dL (ref 6–20)
Bilirubin Total: 0.5 mg/dL (ref 0.0–1.2)
CO2: 25 mmol/L (ref 20–29)
Calcium: 9.8 mg/dL (ref 8.7–10.2)
Chloride: 95 mmol/L — ABNORMAL LOW (ref 96–106)
Creatinine, Ser: 0.97 mg/dL (ref 0.76–1.27)
Globulin, Total: 3.4 g/dL (ref 1.5–4.5)
Glucose: 190 mg/dL — ABNORMAL HIGH (ref 70–99)
Potassium: 4.4 mmol/L (ref 3.5–5.2)
Sodium: 134 mmol/L (ref 134–144)
Total Protein: 7.8 g/dL (ref 6.0–8.5)
eGFR: 105 mL/min/{1.73_m2} (ref >59)

## 2023-10-07 LAB — TSH+FREE T4
Free T4: 1.13 ng/dL (ref 0.82–1.77)
TSH: 3.54 u[IU]/mL (ref 0.450–4.500)

## 2023-10-07 LAB — HEMOGLOBIN A1C
Est. average glucose Bld gHb Est-mCnc: 275 mg/dL
Hgb A1c MFr Bld: 11.2 % — ABNORMAL HIGH (ref 4.8–5.6)

## 2023-10-07 LAB — VITAMIN D 25 HYDROXY (VIT D DEFICIENCY, FRACTURES): Vit D, 25-Hydroxy: 23.6 ng/mL — ABNORMAL LOW (ref 30.0–100.0)

## 2023-10-12 ENCOUNTER — Other Ambulatory Visit: Payer: Self-pay | Admitting: Family Medicine

## 2023-10-12 DIAGNOSIS — E119 Type 2 diabetes mellitus without complications: Secondary | ICD-10-CM

## 2023-10-12 DIAGNOSIS — E1169 Type 2 diabetes mellitus with other specified complication: Secondary | ICD-10-CM

## 2023-10-12 DIAGNOSIS — E785 Hyperlipidemia, unspecified: Secondary | ICD-10-CM

## 2023-10-12 MED ORDER — EZETIMIBE 10 MG PO TABS: 10.0000 mg | ORAL_TABLET | Freq: Every day | ORAL | 3 refills | Status: DC

## 2023-10-12 MED ORDER — TIRZEPATIDE 2.5 MG/0.5ML ~~LOC~~ SOAJ: 2.5000 mg | SUBCUTANEOUS | 0 refills | Status: DC

## 2023-10-31 ENCOUNTER — Encounter: Payer: Self-pay | Admitting: Family Medicine

## 2023-11-06 ENCOUNTER — Ambulatory Visit: Payer: Commercial Managed Care - PPO | Admitting: Family Medicine

## 2024-02-23 ENCOUNTER — Encounter: Payer: Self-pay | Admitting: Family Medicine

## 2024-02-27 ENCOUNTER — Other Ambulatory Visit: Payer: Self-pay

## 2024-02-27 DIAGNOSIS — E119 Type 2 diabetes mellitus without complications: Secondary | ICD-10-CM

## 2024-02-27 DIAGNOSIS — E559 Vitamin D deficiency, unspecified: Secondary | ICD-10-CM

## 2024-02-27 MED ORDER — VITAMIN D (ERGOCALCIFEROL) 1.25 MG (50000 UNIT) PO CAPS
50000.0000 [IU] | ORAL_CAPSULE | ORAL | 0 refills | Status: AC
Start: 2024-02-27 — End: ?

## 2024-02-27 MED ORDER — METFORMIN HCL 1000 MG PO TABS: 1000.0000 mg | ORAL_TABLET | Freq: Two times a day (BID) | ORAL | 0 refills | Status: DC

## 2024-02-29 ENCOUNTER — Ambulatory Visit (INDEPENDENT_AMBULATORY_CARE_PROVIDER_SITE_OTHER): Payer: Self-pay | Admitting: Family Medicine

## 2024-02-29 ENCOUNTER — Encounter: Payer: Self-pay | Admitting: Family Medicine

## 2024-02-29 VITALS — BP 139/91 | HR 67 | Resp 16 | Ht 72.0 in | Wt >= 6400 oz

## 2024-02-29 DIAGNOSIS — E559 Vitamin D deficiency, unspecified: Secondary | ICD-10-CM

## 2024-02-29 DIAGNOSIS — E038 Other specified hypothyroidism: Secondary | ICD-10-CM

## 2024-02-29 DIAGNOSIS — Z7984 Long term (current) use of oral hypoglycemic drugs: Secondary | ICD-10-CM

## 2024-02-29 DIAGNOSIS — E785 Hyperlipidemia, unspecified: Secondary | ICD-10-CM

## 2024-02-29 DIAGNOSIS — E1159 Type 2 diabetes mellitus with other circulatory complications: Secondary | ICD-10-CM

## 2024-02-29 DIAGNOSIS — I10 Essential (primary) hypertension: Secondary | ICD-10-CM

## 2024-02-29 DIAGNOSIS — E1169 Type 2 diabetes mellitus with other specified complication: Secondary | ICD-10-CM

## 2024-02-29 DIAGNOSIS — E7849 Other hyperlipidemia: Secondary | ICD-10-CM

## 2024-02-29 DIAGNOSIS — E119 Type 2 diabetes mellitus without complications: Secondary | ICD-10-CM

## 2024-02-29 MED ORDER — EZETIMIBE 10 MG PO TABS: 10.0000 mg | ORAL_TABLET | Freq: Every day | ORAL | 3 refills | Status: AC

## 2024-02-29 MED ORDER — FREESTYLE LANCETS MISC: 12 refills | Status: AC

## 2024-02-29 MED ORDER — ATORVASTATIN CALCIUM 80 MG PO TABS
80.0000 mg | ORAL_TABLET | Freq: Every day | ORAL | 3 refills | Status: AC
Start: 2024-02-29 — End: ?

## 2024-02-29 MED ORDER — FREESTYLE TEST VI STRP: ORAL_STRIP | 12 refills | Status: AC

## 2024-02-29 MED ORDER — FREESTYLE LIBRE 3 SENSOR MISC: 2 refills | Status: AC

## 2024-02-29 MED ORDER — OLMESARTAN MEDOXOMIL 20 MG PO TABS: 20.0000 mg | ORAL_TABLET | Freq: Every day | ORAL | 1 refills | Status: AC

## 2024-02-29 NOTE — Progress Notes (Signed)
 Established Patient Office Visit  Subjective:  Patient ID: James Quinn, male    DOB: 1991/06/16  Age: 33 y.o. MRN: [id number]  CC:  Chief Complaint  Patient presents with   Diabetes Mellitus    Had a physical at [facility] occupational health and wellness and they found glucose in his urine and wants his A1c checked     HPI James Quinn is a 33 y.o. male with past medical history of type II diabetes, hyperlipidemia and hypertension presents for f/u of  chronic medical conditions. For the details of today's visit, please refer to the assessment and plan.     Past Medical History:  Diagnosis Date   Arthritis    lower back   Eczema    Essential hypertension 11/22/2022   Type 2 diabetes mellitus without complications (HCC) 11/22/2022    History reviewed. No pertinent surgical history.  Family History  Problem Relation Age of Onset   Diabetes Sister    Congestive Heart Failure Sister    Kidney failure Sister    Thyroid disease Sister    Arthritis Father    Hypertension Father    Arthritis Maternal Grandmother    Prostate cancer Maternal Grandfather    Cancer Maternal Grandfather        colon cancer   Arthritis Paternal Grandmother    Stroke Mother     Social History   Socioeconomic History   Marital status: Married    Spouse name: Not on file   Number of children: 0   Years of education: 14   Highest education level: Associate degree: academic program  Occupational History   Occupation: Engineer, materials  Tobacco Use   Smoking status: Never   Smokeless tobacco: Never  Vaping Use   Vaping status: Never Used  Substance and Sexual Activity   Alcohol use: Yes    Comment: occ   Drug use: No   Sexual activity: Yes    Partners: Female    Birth control/protection: Condom  Other Topics Concern   Not on file  Social History Narrative   Fun: Workout, movies, go out bowling.   Denies religious beliefs effecting healthcare.    Social Drivers of Health    Financial Resource Strain: Medium Risk (02/25/2024)   Overall Financial Resource Strain (CARDIA)    Difficulty of Paying Living Expenses: Somewhat hard  Food Insecurity: No Food Insecurity (02/25/2024)   Hunger Vital Sign    Worried About Running Out of Food in the Last Year: Never true    Ran Out of Food in the Last Year: Never true  Transportation Needs: No Transportation Needs (02/25/2024)   PRAPARE - Administrator, Civil Service (Medical): No    Lack of Transportation (Non-Medical): No  Physical Activity: Insufficiently Active (02/25/2024)   Exercise Vital Sign    Days of Exercise per Week: 3 days    Minutes of Exercise per Session: 30 min  Stress: Stress Concern Present (02/25/2024)   [employer] of Occupational Health - Occupational Stress Questionnaire    Feeling of Stress : To some extent  Social Connections: Moderately Isolated (02/25/2024)   Social Connection and Isolation Panel [NHANES]    Frequency of Communication with Friends and Family: Once a week    Frequency of Social Gatherings with Friends and Family: Once a week    Attends Religious Services: More than 4 times per year    Active Member of [employer] or Organizations: No    Attends Banker  Meetings: Not on file    Marital Status: Married  Intimate Partner Violence: Not At Risk (10/23/2023)   Received from [facility]   HITS    Over the last 12 months how often did your partner physically hurt you?: Never    Over the last 12 months how often did your partner insult you or talk down to you?: Never    Over the last 12 months how often did your partner threaten you with physical harm?: Never    Over the last 12 months how often did your partner scream or curse at you?: Never    Outpatient Medications Prior to Visit  Medication Sig Dispense Refill   aspirin EC 81 MG tablet Take 1 tablet (81 mg total) by mouth daily. 90 tablet 3   glucose monitoring kit (FREESTYLE) monitoring kit 1 each by Does  not apply route as needed for other. Use with test strips and lancets to check BS BID 1 each 1   metFORMIN (GLUCOPHAGE) 1000 MG tablet Take 1 tablet (1,000 mg total) by mouth 2 (two) times daily with a meal. 180 tablet 0   Vitamin D, Ergocalciferol, (DRISDOL) 1.25 MG (50000 UNIT) CAPS capsule Take 1 capsule (50,000 Units total) by mouth every 7 (seven) days. 12 capsule 0   Continuous Glucose Sensor (FREESTYLE LIBRE 3 SENSOR) MISC Place 1 sensor on the skin every 14 days. Use to check glucose continuously 6 each 2   glucose blood (FREESTYLE TEST STRIPS) test strip Use to check blood sugars up to 4 times a day. 100 each 12   Lancets (FREESTYLE) lancets Use as instructed 100 each 12   olmesartan (BENICAR) 20 MG tablet Take 1 tablet (20 mg total) by mouth daily. 90 tablet 1   atorvastatin (LIPITOR) 80 MG tablet Take 1 tablet (80 mg total) by mouth daily. (Patient not taking: Reported on 02/29/2024) 90 tablet 3   ezetimibe (ZETIA) 10 MG tablet Take 1 tablet (10 mg total) by mouth daily. (Patient not taking: Reported on 02/29/2024) 90 tablet 3   tirzepatide (MOUNJARO) 2.5 MG/0.5ML Pen Inject 2.5 mg into the skin once a week. (Patient not taking: Reported on 02/29/2024) 2 mL 0   No facility-administered medications prior to visit.    No Known Allergies  ROS Review of Systems  Constitutional:  Negative for fatigue and fever.  Eyes:  Negative for visual disturbance.  Respiratory:  Negative for chest tightness and shortness of breath.   Cardiovascular:  Negative for chest pain and palpitations.  Neurological:  Negative for dizziness and headaches.      Objective:    Physical Exam HENT:     Head: Normocephalic.     Right Ear: External ear normal.     Left Ear: External ear normal.     Nose: No congestion or rhinorrhea.     Mouth/Throat:     Mouth: Mucous membranes are moist.  Cardiovascular:     Rate and Rhythm: Regular rhythm.     Heart sounds: No murmur heard. Pulmonary:     Effort: No  respiratory distress.     Breath sounds: Normal breath sounds.  Neurological:     Mental Status: He is alert.     BP (!) 139/91   Pulse 67   Resp 16   Ht 6' (1.829 m)   Wt (!) 448 lb 1.9 oz (203.3 kg)   SpO2 98%   BMI 60.78 kg/m  Wt Readings from Last 3 Encounters:  02/29/24 (!) 448 lb 1.9  oz (203.3 kg)  10/06/23 (!) 471 lb 1.3 oz (213.7 kg)  12/27/22 (!) 471 lb (213.6 kg)    Lab Results  Component Value Date   TSH 4.050 02/29/2024   Lab Results  Component Value Date   WBC 5.6 02/29/2024   HGB 16.0 02/29/2024   HCT 47.6 02/29/2024   MCV 86 02/29/2024   PLT 274 02/29/2024   Lab Results  Component Value Date   NA 136 02/29/2024   K 4.7 02/29/2024   CO2 25 02/29/2024   GLUCOSE 143 (H) 02/29/2024   BUN 11 02/29/2024   CREATININE 1.15 02/29/2024   BILITOT 0.5 02/29/2024   ALKPHOS 118 02/29/2024   AST 24 02/29/2024   ALT 36 02/29/2024   PROT 7.9 02/29/2024   ALBUMIN 4.7 02/29/2024   CALCIUM 10.0 02/29/2024   ANIONGAP 14 05/13/2020   EGFR 86 02/29/2024   GFR 105.30 12/24/2020   Lab Results  Component Value Date   CHOL 194 02/29/2024   Lab Results  Component Value Date   HDL 37 (L) 02/29/2024   Lab Results  Component Value Date   LDLCALC 133 (H) 02/29/2024   Lab Results  Component Value Date   TRIG 132 02/29/2024   Lab Results  Component Value Date   CHOLHDL 5.2 (H) 02/29/2024   Lab Results  Component Value Date   HGBA1C 9.7 (H) 02/29/2024      Assessment & Plan:  Essential hypertension Assessment & Plan: The patient bp is uncontrolled today in the clinic. He reports being out of his blood pressure medication for the past month but is asymptomatic. We will reinstate therapy with olmesartan 20 mg daily. A low-sodium diet and increased physical activity are recommended to help manage blood pressure. BP Readings from Last 3 Encounters:  02/29/24 (!) 139/91  10/06/23 (!) 140/84  12/27/22 124/74     Orders: -     Olmesartan Medoxomil;  Take 1 tablet (20 mg total) by mouth daily.  Dispense: 90 tablet; Refill: 1  Type 2 diabetes mellitus without complication, without long-term current use of insulin (HCC) Assessment & Plan: The patient reports compliance with Metformin 1000 mg twice daily; however, he admits that he never picked up the prescription for Mounjaro 2.5 mg weekly due to loss of insurance coverage. He reports implementing healthy eating habits and increasing physical activity, with no complaints of increased urination, thirst, or hunger. His hemoglobin A1c will be assessed today. Therapy with Mounjaro 2.5 mg weekly will be reinstated, and he is encouraged to continue taking Metformin 1000 mg twice daily as prescribed. Lifestyle modifications were encouraged, including decreasing intake of high-sugar foods and beverages and increasing physical activity.       Orders: -     Hemoglobin A1c -     Microalbumin / creatinine urine ratio -     FreeStyle Test; Use to check blood sugars up to 4 times a day.  Dispense: 100 each; Refill: 12 -     FreeStyle Lancets; Use as instructed  Dispense: 100 each; Refill: 12 -     FreeStyle Libre 3 Sensor; Place 1 sensor on the skin every 14 days. Use to check glucose continuously  Dispense: 6 each; Refill: 2 -     Tirzepatide; Inject 2.5 mg into the skin once a week.  Dispense: 2 mL; Refill: 0  Hyperlipidemia associated with type 2 diabetes mellitus (HCC) Assessment & Plan: The patient reports that he has been out of Atorvastatin 80 mg for the past month. Atorvastatin  80 mg daily therapy will be reinstated today. He was advised to decrease his intake of greasy, fatty, and starchy foods and to increase physical activity. A lipid panel is pending.     Orders: -     Lipid panel -     CMP14+EGFR -     CBC with Differential/Platelet -     Atorvastatin Calcium; Take 1 tablet (80 mg total) by mouth daily.  Dispense: 90 tablet; Refill: 3 -     Ezetimibe; Take 1 tablet (10 mg total)  by mouth daily.  Dispense: 90 tablet; Refill: 3  Vitamin D deficiency -     VITAMIN D 25 Hydroxy (Vit-D Deficiency, Fractures)  TSH (thyroid-stimulating hormone deficiency) -     TSH + free T4  Note: This chart has been completed using Engineer, civil (consulting) software, and while attempts have been made to ensure accuracy, certain words and phrases may not be transcribed as intended.    Follow-up: Return in about 3 months (around 05/30/2024).   Larose Batres, FNP

## 2024-02-29 NOTE — Patient Instructions (Addendum)
 I appreciate the opportunity to provide care to you today!    Follow up:  3 months  Fasting Labs: please stop by the lab today to get your blood drawn (CBC, CMP, TSH, Lipid profile, HgA1c, Vit D)   Schedule diabetic eye exam   Attached with your AVS, you will find valuable resources for self-education. I highly recommend dedicating some time to thoroughly examine them.   Please continue to a heart-healthy diet and increase your physical activities. Try to exercise for at least five days a week.    It was a pleasure to see you and I look forward to continuing to work together on your health and well-being. Please do not hesitate to call the office if you need care or have questions about your care.  In case of emergency, please visit the Emergency Department for urgent care, or contact our clinic at [phone] to schedule an appointment. We're here to help you!   Have a wonderful day and week. With Gratitude, Gilmore Laroche MSN, FNP-BC

## 2024-03-01 LAB — CMP14+EGFR
ALT: 36 IU/L (ref 0–44)
AST: 24 IU/L (ref 0–40)
Albumin: 4.7 g/dL (ref 4.1–5.1)
Alkaline Phosphatase: 118 IU/L (ref 44–121)
BUN/Creatinine Ratio: 10 (ref 9–20)
BUN: 11 mg/dL (ref 6–20)
Bilirubin Total: 0.5 mg/dL (ref 0.0–1.2)
CO2: 25 mmol/L (ref 20–29)
Calcium: 10 mg/dL (ref 8.7–10.2)
Chloride: 97 mmol/L (ref 96–106)
Creatinine, Ser: 1.15 mg/dL (ref 0.76–1.27)
Globulin, Total: 3.2 g/dL (ref 1.5–4.5)
Glucose: 143 mg/dL — ABNORMAL HIGH (ref 70–99)
Potassium: 4.7 mmol/L (ref 3.5–5.2)
Sodium: 136 mmol/L (ref 134–144)
Total Protein: 7.9 g/dL (ref 6.0–8.5)
eGFR: 86 mL/min/{1.73_m2} (ref >59)

## 2024-03-01 LAB — CBC WITH DIFFERENTIAL/PLATELET
Basophils Absolute: 0 10*3/uL (ref 0.0–0.2)
Basos: 1 %
EOS (ABSOLUTE): 0.2 10*3/uL (ref 0.0–0.4)
Eos: 4 %
Hematocrit: 47.6 % (ref 37.5–51.0)
Hemoglobin: 16 g/dL (ref 13.0–17.7)
Immature Grans (Abs): 0 10*3/uL (ref 0.0–0.1)
Immature Granulocytes: 0 %
Lymphocytes Absolute: 1.9 10*3/uL (ref 0.7–3.1)
Lymphs: 34 %
MCH: 28.8 pg (ref 26.6–33.0)
MCHC: 33.6 g/dL (ref 31.5–35.7)
MCV: 86 fL (ref 79–97)
Monocytes Absolute: 0.4 10*3/uL (ref 0.1–0.9)
Monocytes: 8 %
Neutrophils Absolute: 3 10*3/uL (ref 1.4–7.0)
Neutrophils: 53 %
Platelets: 274 10*3/uL (ref 150–450)
RBC: 5.56 x10E6/uL (ref 4.14–5.80)
RDW: 12.2 % (ref 11.6–15.4)
WBC: 5.6 10*3/uL (ref 3.4–10.8)

## 2024-03-01 LAB — TSH+FREE T4
Free T4: 1.16 ng/dL (ref 0.82–1.77)
TSH: 4.05 u[IU]/mL (ref 0.450–4.500)

## 2024-03-01 LAB — LIPID PANEL
Chol/HDL Ratio: 5.2 ratio — ABNORMAL HIGH (ref 0.0–5.0)
Cholesterol, Total: 194 mg/dL (ref 100–199)
HDL: 37 mg/dL — ABNORMAL LOW (ref >39)
LDL Chol Calc (NIH): 133 mg/dL — ABNORMAL HIGH (ref 0–99)
Triglycerides: 132 mg/dL (ref 0–149)
VLDL Cholesterol Cal: 24 mg/dL (ref 5–40)

## 2024-03-01 LAB — VITAMIN D 25 HYDROXY (VIT D DEFICIENCY, FRACTURES): Vit D, 25-Hydroxy: 30.7 ng/mL (ref 30.0–100.0)

## 2024-03-01 LAB — HEMOGLOBIN A1C
Est. average glucose Bld gHb Est-mCnc: 232 mg/dL
Hgb A1c MFr Bld: 9.7 % — ABNORMAL HIGH (ref 4.8–5.6)

## 2024-03-02 MED ORDER — TIRZEPATIDE 2.5 MG/0.5ML ~~LOC~~ SOAJ: 2.5000 mg | SUBCUTANEOUS | 0 refills | Status: AC

## 2024-03-02 NOTE — Assessment & Plan Note (Signed)
 The patient reports compliance with Metformin 1000 mg twice daily; however, he admits that he never picked up the prescription for Mounjaro 2.5 mg weekly due to loss of insurance coverage. He reports implementing healthy eating habits and increasing physical activity, with no complaints of increased urination, thirst, or hunger. His hemoglobin A1c will be assessed today. Therapy with Mounjaro 2.5 mg weekly will be reinstated, and he is encouraged to continue taking Metformin 1000 mg twice daily as prescribed. Lifestyle modifications were encouraged, including decreasing intake of high-sugar foods and beverages and increasing physical activity.

## 2024-03-02 NOTE — Assessment & Plan Note (Signed)
 The patient reports that he has been out of Atorvastatin 80 mg for the past month. Atorvastatin 80 mg daily therapy will be reinstated today. He was advised to decrease his intake of greasy, fatty, and starchy foods and to increase physical activity. A lipid panel is pending.

## 2024-03-02 NOTE — Assessment & Plan Note (Signed)
 The patient bp is uncontrolled today in the clinic. He reports being out of his blood pressure medication for the past month but is asymptomatic. We will reinstate therapy with olmesartan 20 mg daily. A low-sodium diet and increased physical activity are recommended to help manage blood pressure. BP Readings from Last 3 Encounters:  02/29/24 (!) 139/91  10/06/23 (!) 140/84  12/27/22 124/74

## 2024-03-07 ENCOUNTER — Telehealth: Payer: Self-pay | Admitting: Family Medicine

## 2024-03-07 NOTE — Telephone Encounter (Signed)
 Copied from CRM [id number]. Topic: General - Other >> Mar 07, 2024  1:29 PM Turkey B wrote: Reason for CRM: pt called in to see if form has been received and completed for his physical. Please cb

## 2024-03-08 NOTE — Telephone Encounter (Signed)
 Patient aware his form has been faxed back

## 2024-03-19 ENCOUNTER — Encounter: Payer: Self-pay | Admitting: Family Medicine

## 2024-04-03 ENCOUNTER — Ambulatory Visit: Payer: Self-pay

## 2024-04-03 DIAGNOSIS — E119 Type 2 diabetes mellitus without complications: Secondary | ICD-10-CM

## 2024-04-03 DIAGNOSIS — Z794 Long term (current) use of insulin: Secondary | ICD-10-CM

## 2024-04-03 DIAGNOSIS — E785 Hyperlipidemia, unspecified: Secondary | ICD-10-CM

## 2024-04-03 LAB — HM DIABETES EYE EXAM

## 2024-04-03 NOTE — Progress Notes (Signed)
 James Quinn arrived 04/03/2024 and has given verbal consent to obtain images and complete their overdue diabetic retinal screening.  The images have been sent to an ophthalmologist or optometrist for review and interpretation.  Results will be sent back to Zarwolo, Gloria, FNP for review.  Patient has been informed they will be contacted when we receive the results via telephone or MyChart

## 2024-05-30 ENCOUNTER — Ambulatory Visit: Payer: Self-pay | Admitting: Family Medicine

## 2024-12-06 ENCOUNTER — Other Ambulatory Visit: Payer: Self-pay | Admitting: Family Medicine

## 2024-12-06 DIAGNOSIS — E119 Type 2 diabetes mellitus without complications: Secondary | ICD-10-CM

## 2024-12-06 MED ORDER — METFORMIN HCL 1000 MG PO TABS: 1000.0000 mg | ORAL_TABLET | Freq: Two times a day (BID) | ORAL | 0 refills | Status: AC

## 2024-12-06 NOTE — Telephone Encounter (Signed)
 Source  James Quinn (Patient)   Subject  James Quinn (Patient)   Topic  Clinical - Medication Refill    Communication  Medication:    metFORMIN  (GLUCOPHAGE ) 1000 MG tablet    tirzepatide  (MOUNJARO ) 2.5 MG/0.5ML Pen            Has the patient contacted their pharmacy? No    Pt stated he thought about it while on the line but had received notification that he had ran out of refills    This is the patient's preferred pharmacy:    [id number] - [place], [place] - [address] [facility] [address]    [address]    [place] [address]    Phone: [phone] Fax: [phone]        Is this the correct pharmacy for this prescription? Yes    If no, delete pharmacy and type the correct one.        Has the prescription been filled recently? Yes 03/02/2024, was given 3 refills        Is the patient out of the medication? Yes        Has the patient been seen for an appointment in the last year OR does the patient have an upcoming appointment? Yes        Can we respond through MyChart? Yes      Agent: Please be advised that Rx refills may take up to 3 business days. We ask that you follow-up with your pharmacy.

## 2024-12-09 ENCOUNTER — Other Ambulatory Visit: Payer: Self-pay | Admitting: Family Medicine

## 2024-12-09 DIAGNOSIS — E038 Other specified hypothyroidism: Secondary | ICD-10-CM

## 2024-12-09 DIAGNOSIS — E559 Vitamin D deficiency, unspecified: Secondary | ICD-10-CM

## 2024-12-09 DIAGNOSIS — E119 Type 2 diabetes mellitus without complications: Secondary | ICD-10-CM

## 2024-12-09 DIAGNOSIS — E1169 Type 2 diabetes mellitus with other specified complication: Secondary | ICD-10-CM

## 2024-12-09 MED ORDER — TIRZEPATIDE 2.5 MG/0.5ML ~~LOC~~ SOAJ: 2.5000 mg | SUBCUTANEOUS | 0 refills | Status: AC

## 2024-12-09 NOTE — Telephone Encounter (Signed)
 Refills have been sent to the pharmacy, and orders have been placed for fasting laboratory tests.

## 2024-12-11 ENCOUNTER — Ambulatory Visit: Admitting: Family Medicine

## 2024-12-11 ENCOUNTER — Ambulatory Visit: Payer: Self-pay
